# Patient Record
Sex: Female | Born: 1935 | Race: White | Hispanic: No | Marital: Married | Smoking: Never smoker
Health system: Southern US, Community
[De-identification: ages and names within clinical notes are randomized; demographics above are authoritative.]

## PROBLEM LIST (undated history)

## (undated) ENCOUNTER — Emergency Department (HOSPITAL_COMMUNITY): Payer: Medicare HMO | Source: Home / Self Care

## (undated) DIAGNOSIS — R12 Heartburn: Secondary | ICD-10-CM

## (undated) DIAGNOSIS — R112 Nausea with vomiting, unspecified: Secondary | ICD-10-CM

## (undated) DIAGNOSIS — Z9889 Other specified postprocedural states: Secondary | ICD-10-CM

## (undated) DIAGNOSIS — C801 Malignant (primary) neoplasm, unspecified: Secondary | ICD-10-CM

## (undated) DIAGNOSIS — G43909 Migraine, unspecified, not intractable, without status migrainosus: Secondary | ICD-10-CM

## (undated) DIAGNOSIS — R011 Cardiac murmur, unspecified: Secondary | ICD-10-CM

## (undated) DIAGNOSIS — D649 Anemia, unspecified: Secondary | ICD-10-CM

## (undated) DIAGNOSIS — D496 Neoplasm of unspecified behavior of brain: Secondary | ICD-10-CM

## (undated) DIAGNOSIS — I10 Essential (primary) hypertension: Secondary | ICD-10-CM

## (undated) HISTORY — PX: DILATION AND CURETTAGE OF UTERUS: SHX78

## (undated) HISTORY — PX: OTHER SURGICAL HISTORY: SHX169

## (undated) HISTORY — PX: BREAST SURGERY: SHX581

## (undated) HISTORY — PX: COLONOSCOPY W/ BIOPSIES AND POLYPECTOMY: SHX1376

---

## 2014-10-19 DIAGNOSIS — M5412 Radiculopathy, cervical region: Secondary | ICD-10-CM | POA: Diagnosis not present

## 2014-10-19 DIAGNOSIS — E785 Hyperlipidemia, unspecified: Secondary | ICD-10-CM | POA: Diagnosis not present

## 2014-10-19 DIAGNOSIS — Z6825 Body mass index (BMI) 25.0-25.9, adult: Secondary | ICD-10-CM | POA: Diagnosis not present

## 2014-10-19 DIAGNOSIS — M5382 Other specified dorsopathies, cervical region: Secondary | ICD-10-CM | POA: Diagnosis not present

## 2014-10-19 DIAGNOSIS — I10 Essential (primary) hypertension: Secondary | ICD-10-CM | POA: Diagnosis not present

## 2015-01-11 DIAGNOSIS — Z13820 Encounter for screening for osteoporosis: Secondary | ICD-10-CM | POA: Diagnosis not present

## 2015-01-11 DIAGNOSIS — M8580 Other specified disorders of bone density and structure, unspecified site: Secondary | ICD-10-CM | POA: Diagnosis not present

## 2015-01-11 DIAGNOSIS — Z1231 Encounter for screening mammogram for malignant neoplasm of breast: Secondary | ICD-10-CM | POA: Diagnosis not present

## 2015-01-11 DIAGNOSIS — Z853 Personal history of malignant neoplasm of breast: Secondary | ICD-10-CM | POA: Diagnosis not present

## 2015-01-11 DIAGNOSIS — M8589 Other specified disorders of bone density and structure, multiple sites: Secondary | ICD-10-CM | POA: Diagnosis not present

## 2015-01-25 DIAGNOSIS — Z1389 Encounter for screening for other disorder: Secondary | ICD-10-CM | POA: Diagnosis not present

## 2015-01-25 DIAGNOSIS — E785 Hyperlipidemia, unspecified: Secondary | ICD-10-CM | POA: Diagnosis not present

## 2015-01-25 DIAGNOSIS — I10 Essential (primary) hypertension: Secondary | ICD-10-CM | POA: Diagnosis not present

## 2015-01-25 DIAGNOSIS — Z9181 History of falling: Secondary | ICD-10-CM | POA: Diagnosis not present

## 2015-01-25 DIAGNOSIS — Z6826 Body mass index (BMI) 26.0-26.9, adult: Secondary | ICD-10-CM | POA: Diagnosis not present

## 2015-01-25 DIAGNOSIS — M8580 Other specified disorders of bone density and structure, unspecified site: Secondary | ICD-10-CM | POA: Diagnosis not present

## 2015-01-25 DIAGNOSIS — M5412 Radiculopathy, cervical region: Secondary | ICD-10-CM | POA: Diagnosis not present

## 2015-05-05 DIAGNOSIS — I10 Essential (primary) hypertension: Secondary | ICD-10-CM | POA: Diagnosis not present

## 2015-05-05 DIAGNOSIS — R600 Localized edema: Secondary | ICD-10-CM | POA: Diagnosis not present

## 2015-05-05 DIAGNOSIS — E785 Hyperlipidemia, unspecified: Secondary | ICD-10-CM | POA: Diagnosis not present

## 2015-05-05 DIAGNOSIS — Z6826 Body mass index (BMI) 26.0-26.9, adult: Secondary | ICD-10-CM | POA: Diagnosis not present

## 2015-05-05 DIAGNOSIS — Z139 Encounter for screening, unspecified: Secondary | ICD-10-CM | POA: Diagnosis not present

## 2015-05-05 DIAGNOSIS — M5412 Radiculopathy, cervical region: Secondary | ICD-10-CM | POA: Diagnosis not present

## 2015-11-10 DIAGNOSIS — E785 Hyperlipidemia, unspecified: Secondary | ICD-10-CM | POA: Diagnosis not present

## 2015-11-10 DIAGNOSIS — I10 Essential (primary) hypertension: Secondary | ICD-10-CM | POA: Diagnosis not present

## 2015-11-10 DIAGNOSIS — Z0000 Encounter for general adult medical examination without abnormal findings: Secondary | ICD-10-CM | POA: Diagnosis not present

## 2015-11-10 DIAGNOSIS — M8580 Other specified disorders of bone density and structure, unspecified site: Secondary | ICD-10-CM | POA: Diagnosis not present

## 2015-11-10 DIAGNOSIS — Z79899 Other long term (current) drug therapy: Secondary | ICD-10-CM | POA: Diagnosis not present

## 2015-11-10 DIAGNOSIS — R011 Cardiac murmur, unspecified: Secondary | ICD-10-CM | POA: Diagnosis not present

## 2015-11-10 DIAGNOSIS — Z6826 Body mass index (BMI) 26.0-26.9, adult: Secondary | ICD-10-CM | POA: Diagnosis not present

## 2015-11-10 DIAGNOSIS — Z9181 History of falling: Secondary | ICD-10-CM | POA: Diagnosis not present

## 2015-11-10 DIAGNOSIS — Z1389 Encounter for screening for other disorder: Secondary | ICD-10-CM | POA: Diagnosis not present

## 2015-11-15 DIAGNOSIS — I517 Cardiomegaly: Secondary | ICD-10-CM | POA: Diagnosis not present

## 2015-11-15 DIAGNOSIS — R0989 Other specified symptoms and signs involving the circulatory and respiratory systems: Secondary | ICD-10-CM | POA: Diagnosis not present

## 2015-11-15 DIAGNOSIS — I080 Rheumatic disorders of both mitral and aortic valves: Secondary | ICD-10-CM | POA: Diagnosis not present

## 2015-11-15 DIAGNOSIS — I771 Stricture of artery: Secondary | ICD-10-CM | POA: Diagnosis not present

## 2015-12-04 DIAGNOSIS — M79641 Pain in right hand: Secondary | ICD-10-CM | POA: Diagnosis not present

## 2015-12-04 DIAGNOSIS — S52511A Displaced fracture of right radial styloid process, initial encounter for closed fracture: Secondary | ICD-10-CM | POA: Diagnosis not present

## 2015-12-04 DIAGNOSIS — M25531 Pain in right wrist: Secondary | ICD-10-CM | POA: Diagnosis not present

## 2015-12-04 DIAGNOSIS — S60229A Contusion of unspecified hand, initial encounter: Secondary | ICD-10-CM | POA: Diagnosis not present

## 2015-12-13 DIAGNOSIS — S52511A Displaced fracture of right radial styloid process, initial encounter for closed fracture: Secondary | ICD-10-CM | POA: Diagnosis not present

## 2015-12-22 DIAGNOSIS — R05 Cough: Secondary | ICD-10-CM | POA: Diagnosis not present

## 2015-12-22 DIAGNOSIS — Z6826 Body mass index (BMI) 26.0-26.9, adult: Secondary | ICD-10-CM | POA: Diagnosis not present

## 2015-12-27 DIAGNOSIS — S52511A Displaced fracture of right radial styloid process, initial encounter for closed fracture: Secondary | ICD-10-CM | POA: Diagnosis not present

## 2016-01-10 DIAGNOSIS — S52511A Displaced fracture of right radial styloid process, initial encounter for closed fracture: Secondary | ICD-10-CM | POA: Diagnosis not present

## 2016-01-12 DIAGNOSIS — M85851 Other specified disorders of bone density and structure, right thigh: Secondary | ICD-10-CM | POA: Diagnosis not present

## 2016-01-12 DIAGNOSIS — Z1231 Encounter for screening mammogram for malignant neoplasm of breast: Secondary | ICD-10-CM | POA: Diagnosis not present

## 2016-01-12 DIAGNOSIS — M8589 Other specified disorders of bone density and structure, multiple sites: Secondary | ICD-10-CM | POA: Diagnosis not present

## 2016-01-12 DIAGNOSIS — R921 Mammographic calcification found on diagnostic imaging of breast: Secondary | ICD-10-CM | POA: Diagnosis not present

## 2016-01-24 DIAGNOSIS — R921 Mammographic calcification found on diagnostic imaging of breast: Secondary | ICD-10-CM | POA: Diagnosis not present

## 2016-01-24 DIAGNOSIS — R928 Other abnormal and inconclusive findings on diagnostic imaging of breast: Secondary | ICD-10-CM | POA: Diagnosis not present

## 2016-02-07 DIAGNOSIS — S52511A Displaced fracture of right radial styloid process, initial encounter for closed fracture: Secondary | ICD-10-CM | POA: Diagnosis not present

## 2016-02-07 DIAGNOSIS — R921 Mammographic calcification found on diagnostic imaging of breast: Secondary | ICD-10-CM | POA: Diagnosis not present

## 2016-02-07 DIAGNOSIS — R928 Other abnormal and inconclusive findings on diagnostic imaging of breast: Secondary | ICD-10-CM | POA: Diagnosis not present

## 2016-02-07 DIAGNOSIS — D0512 Intraductal carcinoma in situ of left breast: Secondary | ICD-10-CM | POA: Diagnosis not present

## 2016-02-27 DIAGNOSIS — D0512 Intraductal carcinoma in situ of left breast: Secondary | ICD-10-CM | POA: Diagnosis not present

## 2016-03-11 DIAGNOSIS — I10 Essential (primary) hypertension: Secondary | ICD-10-CM | POA: Diagnosis not present

## 2016-03-11 DIAGNOSIS — Z01818 Encounter for other preprocedural examination: Secondary | ICD-10-CM | POA: Diagnosis not present

## 2016-03-13 DIAGNOSIS — E785 Hyperlipidemia, unspecified: Secondary | ICD-10-CM | POA: Diagnosis not present

## 2016-03-13 DIAGNOSIS — K219 Gastro-esophageal reflux disease without esophagitis: Secondary | ICD-10-CM | POA: Diagnosis not present

## 2016-03-13 DIAGNOSIS — Z79899 Other long term (current) drug therapy: Secondary | ICD-10-CM | POA: Diagnosis not present

## 2016-03-13 DIAGNOSIS — I10 Essential (primary) hypertension: Secondary | ICD-10-CM | POA: Diagnosis not present

## 2016-03-13 DIAGNOSIS — J45909 Unspecified asthma, uncomplicated: Secondary | ICD-10-CM | POA: Diagnosis not present

## 2016-03-13 DIAGNOSIS — D0512 Intraductal carcinoma in situ of left breast: Secondary | ICD-10-CM | POA: Diagnosis not present

## 2016-04-15 DIAGNOSIS — D0512 Intraductal carcinoma in situ of left breast: Secondary | ICD-10-CM | POA: Diagnosis not present

## 2016-05-07 DIAGNOSIS — J309 Allergic rhinitis, unspecified: Secondary | ICD-10-CM | POA: Diagnosis not present

## 2016-05-07 DIAGNOSIS — N63 Unspecified lump in breast: Secondary | ICD-10-CM | POA: Diagnosis not present

## 2016-05-08 DIAGNOSIS — D508 Other iron deficiency anemias: Secondary | ICD-10-CM | POA: Diagnosis not present

## 2016-05-08 DIAGNOSIS — M25551 Pain in right hip: Secondary | ICD-10-CM | POA: Diagnosis not present

## 2016-05-08 DIAGNOSIS — Z853 Personal history of malignant neoplasm of breast: Secondary | ICD-10-CM | POA: Diagnosis not present

## 2016-05-08 DIAGNOSIS — E871 Hypo-osmolality and hyponatremia: Secondary | ICD-10-CM | POA: Diagnosis not present

## 2016-05-08 DIAGNOSIS — Z96641 Presence of right artificial hip joint: Secondary | ICD-10-CM | POA: Diagnosis not present

## 2016-05-08 DIAGNOSIS — S72011A Unspecified intracapsular fracture of right femur, initial encounter for closed fracture: Secondary | ICD-10-CM | POA: Diagnosis not present

## 2016-05-08 DIAGNOSIS — D649 Anemia, unspecified: Secondary | ICD-10-CM | POA: Diagnosis not present

## 2016-05-08 DIAGNOSIS — Z7982 Long term (current) use of aspirin: Secondary | ICD-10-CM | POA: Diagnosis not present

## 2016-05-08 DIAGNOSIS — R252 Cramp and spasm: Secondary | ICD-10-CM | POA: Diagnosis not present

## 2016-05-08 DIAGNOSIS — S72001A Fracture of unspecified part of neck of right femur, initial encounter for closed fracture: Secondary | ICD-10-CM | POA: Diagnosis not present

## 2016-05-08 DIAGNOSIS — M1990 Unspecified osteoarthritis, unspecified site: Secondary | ICD-10-CM | POA: Diagnosis not present

## 2016-05-08 DIAGNOSIS — S72009A Fracture of unspecified part of neck of unspecified femur, initial encounter for closed fracture: Secondary | ICD-10-CM | POA: Diagnosis not present

## 2016-05-08 DIAGNOSIS — Z471 Aftercare following joint replacement surgery: Secondary | ICD-10-CM | POA: Diagnosis not present

## 2016-05-08 DIAGNOSIS — Z79899 Other long term (current) drug therapy: Secondary | ICD-10-CM | POA: Diagnosis not present

## 2016-05-08 DIAGNOSIS — S72001D Fracture of unspecified part of neck of right femur, subsequent encounter for closed fracture with routine healing: Secondary | ICD-10-CM | POA: Diagnosis not present

## 2016-05-08 DIAGNOSIS — M84451A Pathological fracture, right femur, initial encounter for fracture: Secondary | ICD-10-CM | POA: Diagnosis not present

## 2016-05-08 DIAGNOSIS — S72041A Displaced fracture of base of neck of right femur, initial encounter for closed fracture: Secondary | ICD-10-CM | POA: Diagnosis not present

## 2016-05-08 DIAGNOSIS — M1611 Unilateral primary osteoarthritis, right hip: Secondary | ICD-10-CM | POA: Diagnosis not present

## 2016-05-08 DIAGNOSIS — S299XXA Unspecified injury of thorax, initial encounter: Secondary | ICD-10-CM | POA: Diagnosis not present

## 2016-05-08 DIAGNOSIS — I10 Essential (primary) hypertension: Secondary | ICD-10-CM | POA: Diagnosis not present

## 2016-05-09 DIAGNOSIS — S72001A Fracture of unspecified part of neck of right femur, initial encounter for closed fracture: Secondary | ICD-10-CM | POA: Diagnosis not present

## 2016-05-09 DIAGNOSIS — Z853 Personal history of malignant neoplasm of breast: Secondary | ICD-10-CM | POA: Diagnosis not present

## 2016-05-09 DIAGNOSIS — D649 Anemia, unspecified: Secondary | ICD-10-CM | POA: Diagnosis not present

## 2016-05-09 DIAGNOSIS — I10 Essential (primary) hypertension: Secondary | ICD-10-CM | POA: Diagnosis not present

## 2016-05-12 DIAGNOSIS — D649 Anemia, unspecified: Secondary | ICD-10-CM

## 2016-05-12 DIAGNOSIS — E871 Hypo-osmolality and hyponatremia: Secondary | ICD-10-CM

## 2016-05-13 DIAGNOSIS — R252 Cramp and spasm: Secondary | ICD-10-CM

## 2016-05-14 DIAGNOSIS — D649 Anemia, unspecified: Secondary | ICD-10-CM | POA: Diagnosis not present

## 2016-05-14 DIAGNOSIS — Z9181 History of falling: Secondary | ICD-10-CM | POA: Diagnosis not present

## 2016-05-14 DIAGNOSIS — M439 Deforming dorsopathy, unspecified: Secondary | ICD-10-CM | POA: Diagnosis not present

## 2016-05-14 DIAGNOSIS — S72001A Fracture of unspecified part of neck of right femur, initial encounter for closed fracture: Secondary | ICD-10-CM | POA: Diagnosis not present

## 2016-05-14 DIAGNOSIS — M1990 Unspecified osteoarthritis, unspecified site: Secondary | ICD-10-CM | POA: Diagnosis not present

## 2016-05-14 DIAGNOSIS — Z853 Personal history of malignant neoplasm of breast: Secondary | ICD-10-CM | POA: Diagnosis not present

## 2016-05-14 DIAGNOSIS — I10 Essential (primary) hypertension: Secondary | ICD-10-CM | POA: Diagnosis not present

## 2016-05-14 DIAGNOSIS — M150 Primary generalized (osteo)arthritis: Secondary | ICD-10-CM | POA: Diagnosis not present

## 2016-05-14 DIAGNOSIS — R252 Cramp and spasm: Secondary | ICD-10-CM | POA: Diagnosis not present

## 2016-05-14 DIAGNOSIS — E871 Hypo-osmolality and hyponatremia: Secondary | ICD-10-CM | POA: Diagnosis not present

## 2016-05-14 DIAGNOSIS — S72001D Fracture of unspecified part of neck of right femur, subsequent encounter for closed fracture with routine healing: Secondary | ICD-10-CM | POA: Diagnosis not present

## 2016-05-14 DIAGNOSIS — R262 Difficulty in walking, not elsewhere classified: Secondary | ICD-10-CM | POA: Diagnosis not present

## 2016-05-14 DIAGNOSIS — Z7901 Long term (current) use of anticoagulants: Secondary | ICD-10-CM | POA: Diagnosis not present

## 2016-05-14 DIAGNOSIS — D508 Other iron deficiency anemias: Secondary | ICD-10-CM | POA: Diagnosis not present

## 2016-05-14 DIAGNOSIS — G8918 Other acute postprocedural pain: Secondary | ICD-10-CM | POA: Diagnosis not present

## 2016-05-14 DIAGNOSIS — Z79899 Other long term (current) drug therapy: Secondary | ICD-10-CM | POA: Diagnosis not present

## 2016-05-14 DIAGNOSIS — Z7982 Long term (current) use of aspirin: Secondary | ICD-10-CM | POA: Diagnosis not present

## 2016-05-14 DIAGNOSIS — S72009A Fracture of unspecified part of neck of unspecified femur, initial encounter for closed fracture: Secondary | ICD-10-CM | POA: Diagnosis not present

## 2016-05-14 DIAGNOSIS — S72011A Unspecified intracapsular fracture of right femur, initial encounter for closed fracture: Secondary | ICD-10-CM | POA: Diagnosis not present

## 2016-05-14 DIAGNOSIS — Z79891 Long term (current) use of opiate analgesic: Secondary | ICD-10-CM | POA: Diagnosis not present

## 2016-05-18 DIAGNOSIS — D649 Anemia, unspecified: Secondary | ICD-10-CM | POA: Diagnosis not present

## 2016-05-18 DIAGNOSIS — M439 Deforming dorsopathy, unspecified: Secondary | ICD-10-CM | POA: Diagnosis not present

## 2016-05-18 DIAGNOSIS — G8918 Other acute postprocedural pain: Secondary | ICD-10-CM | POA: Diagnosis not present

## 2016-05-18 DIAGNOSIS — R262 Difficulty in walking, not elsewhere classified: Secondary | ICD-10-CM | POA: Diagnosis not present

## 2016-05-25 DIAGNOSIS — S72001D Fracture of unspecified part of neck of right femur, subsequent encounter for closed fracture with routine healing: Secondary | ICD-10-CM | POA: Diagnosis not present

## 2016-05-25 DIAGNOSIS — Z853 Personal history of malignant neoplasm of breast: Secondary | ICD-10-CM | POA: Diagnosis not present

## 2016-05-25 DIAGNOSIS — Z7982 Long term (current) use of aspirin: Secondary | ICD-10-CM | POA: Diagnosis not present

## 2016-05-25 DIAGNOSIS — Z9181 History of falling: Secondary | ICD-10-CM | POA: Diagnosis not present

## 2016-05-25 DIAGNOSIS — I10 Essential (primary) hypertension: Secondary | ICD-10-CM | POA: Diagnosis not present

## 2016-05-25 DIAGNOSIS — D649 Anemia, unspecified: Secondary | ICD-10-CM | POA: Diagnosis not present

## 2016-05-25 DIAGNOSIS — M150 Primary generalized (osteo)arthritis: Secondary | ICD-10-CM | POA: Diagnosis not present

## 2016-05-25 DIAGNOSIS — Z7901 Long term (current) use of anticoagulants: Secondary | ICD-10-CM | POA: Diagnosis not present

## 2016-05-25 DIAGNOSIS — Z79891 Long term (current) use of opiate analgesic: Secondary | ICD-10-CM | POA: Diagnosis not present

## 2016-05-28 DIAGNOSIS — D649 Anemia, unspecified: Secondary | ICD-10-CM | POA: Diagnosis not present

## 2016-05-28 DIAGNOSIS — Z853 Personal history of malignant neoplasm of breast: Secondary | ICD-10-CM | POA: Diagnosis not present

## 2016-05-28 DIAGNOSIS — Z79891 Long term (current) use of opiate analgesic: Secondary | ICD-10-CM | POA: Diagnosis not present

## 2016-05-28 DIAGNOSIS — Z9181 History of falling: Secondary | ICD-10-CM | POA: Diagnosis not present

## 2016-05-28 DIAGNOSIS — S72001D Fracture of unspecified part of neck of right femur, subsequent encounter for closed fracture with routine healing: Secondary | ICD-10-CM | POA: Diagnosis not present

## 2016-05-28 DIAGNOSIS — Z7982 Long term (current) use of aspirin: Secondary | ICD-10-CM | POA: Diagnosis not present

## 2016-05-28 DIAGNOSIS — I10 Essential (primary) hypertension: Secondary | ICD-10-CM | POA: Diagnosis not present

## 2016-05-28 DIAGNOSIS — M150 Primary generalized (osteo)arthritis: Secondary | ICD-10-CM | POA: Diagnosis not present

## 2016-05-28 DIAGNOSIS — Z7901 Long term (current) use of anticoagulants: Secondary | ICD-10-CM | POA: Diagnosis not present

## 2016-05-29 DIAGNOSIS — Z79891 Long term (current) use of opiate analgesic: Secondary | ICD-10-CM | POA: Diagnosis not present

## 2016-05-29 DIAGNOSIS — Z9181 History of falling: Secondary | ICD-10-CM | POA: Diagnosis not present

## 2016-05-29 DIAGNOSIS — Z7901 Long term (current) use of anticoagulants: Secondary | ICD-10-CM | POA: Diagnosis not present

## 2016-05-29 DIAGNOSIS — I10 Essential (primary) hypertension: Secondary | ICD-10-CM | POA: Diagnosis not present

## 2016-05-29 DIAGNOSIS — Z853 Personal history of malignant neoplasm of breast: Secondary | ICD-10-CM | POA: Diagnosis not present

## 2016-05-29 DIAGNOSIS — S72001D Fracture of unspecified part of neck of right femur, subsequent encounter for closed fracture with routine healing: Secondary | ICD-10-CM | POA: Diagnosis not present

## 2016-05-29 DIAGNOSIS — M150 Primary generalized (osteo)arthritis: Secondary | ICD-10-CM | POA: Diagnosis not present

## 2016-05-29 DIAGNOSIS — Z7982 Long term (current) use of aspirin: Secondary | ICD-10-CM | POA: Diagnosis not present

## 2016-05-29 DIAGNOSIS — D649 Anemia, unspecified: Secondary | ICD-10-CM | POA: Diagnosis not present

## 2016-05-30 DIAGNOSIS — Z9181 History of falling: Secondary | ICD-10-CM | POA: Diagnosis not present

## 2016-05-30 DIAGNOSIS — I10 Essential (primary) hypertension: Secondary | ICD-10-CM | POA: Diagnosis not present

## 2016-05-30 DIAGNOSIS — Z7901 Long term (current) use of anticoagulants: Secondary | ICD-10-CM | POA: Diagnosis not present

## 2016-05-30 DIAGNOSIS — Z79891 Long term (current) use of opiate analgesic: Secondary | ICD-10-CM | POA: Diagnosis not present

## 2016-05-30 DIAGNOSIS — D649 Anemia, unspecified: Secondary | ICD-10-CM | POA: Diagnosis not present

## 2016-05-30 DIAGNOSIS — S72001D Fracture of unspecified part of neck of right femur, subsequent encounter for closed fracture with routine healing: Secondary | ICD-10-CM | POA: Diagnosis not present

## 2016-05-30 DIAGNOSIS — Z853 Personal history of malignant neoplasm of breast: Secondary | ICD-10-CM | POA: Diagnosis not present

## 2016-05-30 DIAGNOSIS — Z7982 Long term (current) use of aspirin: Secondary | ICD-10-CM | POA: Diagnosis not present

## 2016-05-30 DIAGNOSIS — M150 Primary generalized (osteo)arthritis: Secondary | ICD-10-CM | POA: Diagnosis not present

## 2016-05-31 DIAGNOSIS — Z7982 Long term (current) use of aspirin: Secondary | ICD-10-CM | POA: Diagnosis not present

## 2016-05-31 DIAGNOSIS — I10 Essential (primary) hypertension: Secondary | ICD-10-CM | POA: Diagnosis not present

## 2016-05-31 DIAGNOSIS — S72001D Fracture of unspecified part of neck of right femur, subsequent encounter for closed fracture with routine healing: Secondary | ICD-10-CM | POA: Diagnosis not present

## 2016-05-31 DIAGNOSIS — M150 Primary generalized (osteo)arthritis: Secondary | ICD-10-CM | POA: Diagnosis not present

## 2016-05-31 DIAGNOSIS — Z853 Personal history of malignant neoplasm of breast: Secondary | ICD-10-CM | POA: Diagnosis not present

## 2016-05-31 DIAGNOSIS — Z9181 History of falling: Secondary | ICD-10-CM | POA: Diagnosis not present

## 2016-05-31 DIAGNOSIS — Z79891 Long term (current) use of opiate analgesic: Secondary | ICD-10-CM | POA: Diagnosis not present

## 2016-05-31 DIAGNOSIS — Z7901 Long term (current) use of anticoagulants: Secondary | ICD-10-CM | POA: Diagnosis not present

## 2016-05-31 DIAGNOSIS — D649 Anemia, unspecified: Secondary | ICD-10-CM | POA: Diagnosis not present

## 2016-06-03 DIAGNOSIS — D649 Anemia, unspecified: Secondary | ICD-10-CM | POA: Diagnosis not present

## 2016-06-03 DIAGNOSIS — Z853 Personal history of malignant neoplasm of breast: Secondary | ICD-10-CM | POA: Diagnosis not present

## 2016-06-03 DIAGNOSIS — Z7901 Long term (current) use of anticoagulants: Secondary | ICD-10-CM | POA: Diagnosis not present

## 2016-06-03 DIAGNOSIS — Z7982 Long term (current) use of aspirin: Secondary | ICD-10-CM | POA: Diagnosis not present

## 2016-06-03 DIAGNOSIS — S72001D Fracture of unspecified part of neck of right femur, subsequent encounter for closed fracture with routine healing: Secondary | ICD-10-CM | POA: Diagnosis not present

## 2016-06-03 DIAGNOSIS — I10 Essential (primary) hypertension: Secondary | ICD-10-CM | POA: Diagnosis not present

## 2016-06-03 DIAGNOSIS — Z79891 Long term (current) use of opiate analgesic: Secondary | ICD-10-CM | POA: Diagnosis not present

## 2016-06-03 DIAGNOSIS — M150 Primary generalized (osteo)arthritis: Secondary | ICD-10-CM | POA: Diagnosis not present

## 2016-06-03 DIAGNOSIS — Z9181 History of falling: Secondary | ICD-10-CM | POA: Diagnosis not present

## 2016-06-05 DIAGNOSIS — Z7982 Long term (current) use of aspirin: Secondary | ICD-10-CM | POA: Diagnosis not present

## 2016-06-05 DIAGNOSIS — I10 Essential (primary) hypertension: Secondary | ICD-10-CM | POA: Diagnosis not present

## 2016-06-05 DIAGNOSIS — Z9181 History of falling: Secondary | ICD-10-CM | POA: Diagnosis not present

## 2016-06-05 DIAGNOSIS — Z79891 Long term (current) use of opiate analgesic: Secondary | ICD-10-CM | POA: Diagnosis not present

## 2016-06-05 DIAGNOSIS — Z853 Personal history of malignant neoplasm of breast: Secondary | ICD-10-CM | POA: Diagnosis not present

## 2016-06-05 DIAGNOSIS — S72001D Fracture of unspecified part of neck of right femur, subsequent encounter for closed fracture with routine healing: Secondary | ICD-10-CM | POA: Diagnosis not present

## 2016-06-05 DIAGNOSIS — D649 Anemia, unspecified: Secondary | ICD-10-CM | POA: Diagnosis not present

## 2016-06-05 DIAGNOSIS — Z7901 Long term (current) use of anticoagulants: Secondary | ICD-10-CM | POA: Diagnosis not present

## 2016-06-05 DIAGNOSIS — M150 Primary generalized (osteo)arthritis: Secondary | ICD-10-CM | POA: Diagnosis not present

## 2016-06-06 DIAGNOSIS — Z7982 Long term (current) use of aspirin: Secondary | ICD-10-CM | POA: Diagnosis not present

## 2016-06-06 DIAGNOSIS — Z9181 History of falling: Secondary | ICD-10-CM | POA: Diagnosis not present

## 2016-06-06 DIAGNOSIS — Z853 Personal history of malignant neoplasm of breast: Secondary | ICD-10-CM | POA: Diagnosis not present

## 2016-06-06 DIAGNOSIS — D649 Anemia, unspecified: Secondary | ICD-10-CM | POA: Diagnosis not present

## 2016-06-06 DIAGNOSIS — M150 Primary generalized (osteo)arthritis: Secondary | ICD-10-CM | POA: Diagnosis not present

## 2016-06-06 DIAGNOSIS — Z7901 Long term (current) use of anticoagulants: Secondary | ICD-10-CM | POA: Diagnosis not present

## 2016-06-06 DIAGNOSIS — S72001D Fracture of unspecified part of neck of right femur, subsequent encounter for closed fracture with routine healing: Secondary | ICD-10-CM | POA: Diagnosis not present

## 2016-06-06 DIAGNOSIS — Z79891 Long term (current) use of opiate analgesic: Secondary | ICD-10-CM | POA: Diagnosis not present

## 2016-06-06 DIAGNOSIS — I10 Essential (primary) hypertension: Secondary | ICD-10-CM | POA: Diagnosis not present

## 2016-06-07 DIAGNOSIS — D649 Anemia, unspecified: Secondary | ICD-10-CM | POA: Diagnosis not present

## 2016-06-07 DIAGNOSIS — S72001A Fracture of unspecified part of neck of right femur, initial encounter for closed fracture: Secondary | ICD-10-CM | POA: Diagnosis not present

## 2016-06-07 DIAGNOSIS — Z79891 Long term (current) use of opiate analgesic: Secondary | ICD-10-CM | POA: Diagnosis not present

## 2016-06-07 DIAGNOSIS — I10 Essential (primary) hypertension: Secondary | ICD-10-CM | POA: Diagnosis not present

## 2016-06-07 DIAGNOSIS — M150 Primary generalized (osteo)arthritis: Secondary | ICD-10-CM | POA: Diagnosis not present

## 2016-06-07 DIAGNOSIS — D539 Nutritional anemia, unspecified: Secondary | ICD-10-CM | POA: Diagnosis not present

## 2016-06-07 DIAGNOSIS — Z7982 Long term (current) use of aspirin: Secondary | ICD-10-CM | POA: Diagnosis not present

## 2016-06-07 DIAGNOSIS — R350 Frequency of micturition: Secondary | ICD-10-CM | POA: Diagnosis not present

## 2016-06-07 DIAGNOSIS — Z7901 Long term (current) use of anticoagulants: Secondary | ICD-10-CM | POA: Diagnosis not present

## 2016-06-07 DIAGNOSIS — Z853 Personal history of malignant neoplasm of breast: Secondary | ICD-10-CM | POA: Diagnosis not present

## 2016-06-07 DIAGNOSIS — Z79899 Other long term (current) drug therapy: Secondary | ICD-10-CM | POA: Diagnosis not present

## 2016-06-07 DIAGNOSIS — S72001D Fracture of unspecified part of neck of right femur, subsequent encounter for closed fracture with routine healing: Secondary | ICD-10-CM | POA: Diagnosis not present

## 2016-06-07 DIAGNOSIS — Z9181 History of falling: Secondary | ICD-10-CM | POA: Diagnosis not present

## 2016-06-12 DIAGNOSIS — Z7901 Long term (current) use of anticoagulants: Secondary | ICD-10-CM | POA: Diagnosis not present

## 2016-06-12 DIAGNOSIS — Z9181 History of falling: Secondary | ICD-10-CM | POA: Diagnosis not present

## 2016-06-12 DIAGNOSIS — D649 Anemia, unspecified: Secondary | ICD-10-CM | POA: Diagnosis not present

## 2016-06-12 DIAGNOSIS — M150 Primary generalized (osteo)arthritis: Secondary | ICD-10-CM | POA: Diagnosis not present

## 2016-06-12 DIAGNOSIS — Z79891 Long term (current) use of opiate analgesic: Secondary | ICD-10-CM | POA: Diagnosis not present

## 2016-06-12 DIAGNOSIS — I10 Essential (primary) hypertension: Secondary | ICD-10-CM | POA: Diagnosis not present

## 2016-06-12 DIAGNOSIS — Z853 Personal history of malignant neoplasm of breast: Secondary | ICD-10-CM | POA: Diagnosis not present

## 2016-06-12 DIAGNOSIS — S72001D Fracture of unspecified part of neck of right femur, subsequent encounter for closed fracture with routine healing: Secondary | ICD-10-CM | POA: Diagnosis not present

## 2016-06-12 DIAGNOSIS — Z7982 Long term (current) use of aspirin: Secondary | ICD-10-CM | POA: Diagnosis not present

## 2016-06-13 DIAGNOSIS — Z7901 Long term (current) use of anticoagulants: Secondary | ICD-10-CM | POA: Diagnosis not present

## 2016-06-13 DIAGNOSIS — M150 Primary generalized (osteo)arthritis: Secondary | ICD-10-CM | POA: Diagnosis not present

## 2016-06-13 DIAGNOSIS — Z79891 Long term (current) use of opiate analgesic: Secondary | ICD-10-CM | POA: Diagnosis not present

## 2016-06-13 DIAGNOSIS — Z9181 History of falling: Secondary | ICD-10-CM | POA: Diagnosis not present

## 2016-06-13 DIAGNOSIS — D649 Anemia, unspecified: Secondary | ICD-10-CM | POA: Diagnosis not present

## 2016-06-13 DIAGNOSIS — S72001D Fracture of unspecified part of neck of right femur, subsequent encounter for closed fracture with routine healing: Secondary | ICD-10-CM | POA: Diagnosis not present

## 2016-06-13 DIAGNOSIS — Z7982 Long term (current) use of aspirin: Secondary | ICD-10-CM | POA: Diagnosis not present

## 2016-06-13 DIAGNOSIS — I10 Essential (primary) hypertension: Secondary | ICD-10-CM | POA: Diagnosis not present

## 2016-06-13 DIAGNOSIS — Z853 Personal history of malignant neoplasm of breast: Secondary | ICD-10-CM | POA: Diagnosis not present

## 2016-06-14 DIAGNOSIS — S72001D Fracture of unspecified part of neck of right femur, subsequent encounter for closed fracture with routine healing: Secondary | ICD-10-CM | POA: Diagnosis not present

## 2016-06-14 DIAGNOSIS — Z7982 Long term (current) use of aspirin: Secondary | ICD-10-CM | POA: Diagnosis not present

## 2016-06-14 DIAGNOSIS — Z7901 Long term (current) use of anticoagulants: Secondary | ICD-10-CM | POA: Diagnosis not present

## 2016-06-14 DIAGNOSIS — Z853 Personal history of malignant neoplasm of breast: Secondary | ICD-10-CM | POA: Diagnosis not present

## 2016-06-14 DIAGNOSIS — Z79891 Long term (current) use of opiate analgesic: Secondary | ICD-10-CM | POA: Diagnosis not present

## 2016-06-14 DIAGNOSIS — Z9181 History of falling: Secondary | ICD-10-CM | POA: Diagnosis not present

## 2016-06-14 DIAGNOSIS — M150 Primary generalized (osteo)arthritis: Secondary | ICD-10-CM | POA: Diagnosis not present

## 2016-06-14 DIAGNOSIS — D649 Anemia, unspecified: Secondary | ICD-10-CM | POA: Diagnosis not present

## 2016-06-14 DIAGNOSIS — I10 Essential (primary) hypertension: Secondary | ICD-10-CM | POA: Diagnosis not present

## 2016-06-18 DIAGNOSIS — Z7901 Long term (current) use of anticoagulants: Secondary | ICD-10-CM | POA: Diagnosis not present

## 2016-06-18 DIAGNOSIS — S72001D Fracture of unspecified part of neck of right femur, subsequent encounter for closed fracture with routine healing: Secondary | ICD-10-CM | POA: Diagnosis not present

## 2016-06-18 DIAGNOSIS — Z9181 History of falling: Secondary | ICD-10-CM | POA: Diagnosis not present

## 2016-06-18 DIAGNOSIS — Z7982 Long term (current) use of aspirin: Secondary | ICD-10-CM | POA: Diagnosis not present

## 2016-06-18 DIAGNOSIS — I10 Essential (primary) hypertension: Secondary | ICD-10-CM | POA: Diagnosis not present

## 2016-06-18 DIAGNOSIS — D649 Anemia, unspecified: Secondary | ICD-10-CM | POA: Diagnosis not present

## 2016-06-18 DIAGNOSIS — M150 Primary generalized (osteo)arthritis: Secondary | ICD-10-CM | POA: Diagnosis not present

## 2016-06-18 DIAGNOSIS — Z853 Personal history of malignant neoplasm of breast: Secondary | ICD-10-CM | POA: Diagnosis not present

## 2016-06-18 DIAGNOSIS — Z79891 Long term (current) use of opiate analgesic: Secondary | ICD-10-CM | POA: Diagnosis not present

## 2016-06-19 DIAGNOSIS — S72001A Fracture of unspecified part of neck of right femur, initial encounter for closed fracture: Secondary | ICD-10-CM | POA: Diagnosis not present

## 2016-06-20 DIAGNOSIS — Z853 Personal history of malignant neoplasm of breast: Secondary | ICD-10-CM | POA: Diagnosis not present

## 2016-06-20 DIAGNOSIS — Z7901 Long term (current) use of anticoagulants: Secondary | ICD-10-CM | POA: Diagnosis not present

## 2016-06-20 DIAGNOSIS — Z7982 Long term (current) use of aspirin: Secondary | ICD-10-CM | POA: Diagnosis not present

## 2016-06-20 DIAGNOSIS — Z79891 Long term (current) use of opiate analgesic: Secondary | ICD-10-CM | POA: Diagnosis not present

## 2016-06-20 DIAGNOSIS — Z9181 History of falling: Secondary | ICD-10-CM | POA: Diagnosis not present

## 2016-06-20 DIAGNOSIS — I10 Essential (primary) hypertension: Secondary | ICD-10-CM | POA: Diagnosis not present

## 2016-06-20 DIAGNOSIS — D649 Anemia, unspecified: Secondary | ICD-10-CM | POA: Diagnosis not present

## 2016-06-20 DIAGNOSIS — S72001D Fracture of unspecified part of neck of right femur, subsequent encounter for closed fracture with routine healing: Secondary | ICD-10-CM | POA: Diagnosis not present

## 2016-06-20 DIAGNOSIS — M150 Primary generalized (osteo)arthritis: Secondary | ICD-10-CM | POA: Diagnosis not present

## 2016-07-10 DIAGNOSIS — I10 Essential (primary) hypertension: Secondary | ICD-10-CM | POA: Diagnosis not present

## 2016-07-10 DIAGNOSIS — E785 Hyperlipidemia, unspecified: Secondary | ICD-10-CM | POA: Diagnosis not present

## 2016-08-14 DIAGNOSIS — H524 Presbyopia: Secondary | ICD-10-CM | POA: Diagnosis not present

## 2016-08-14 DIAGNOSIS — H2513 Age-related nuclear cataract, bilateral: Secondary | ICD-10-CM | POA: Diagnosis not present

## 2016-09-04 DIAGNOSIS — I10 Essential (primary) hypertension: Secondary | ICD-10-CM | POA: Diagnosis not present

## 2016-10-16 DIAGNOSIS — D539 Nutritional anemia, unspecified: Secondary | ICD-10-CM | POA: Diagnosis not present

## 2016-10-16 DIAGNOSIS — I10 Essential (primary) hypertension: Secondary | ICD-10-CM | POA: Diagnosis not present

## 2016-10-16 DIAGNOSIS — E785 Hyperlipidemia, unspecified: Secondary | ICD-10-CM | POA: Diagnosis not present

## 2016-10-16 DIAGNOSIS — R600 Localized edema: Secondary | ICD-10-CM | POA: Diagnosis not present

## 2016-10-30 DIAGNOSIS — S72001D Fracture of unspecified part of neck of right femur, subsequent encounter for closed fracture with routine healing: Secondary | ICD-10-CM | POA: Diagnosis not present

## 2016-11-29 DIAGNOSIS — H43813 Vitreous degeneration, bilateral: Secondary | ICD-10-CM | POA: Diagnosis not present

## 2016-11-29 DIAGNOSIS — I10 Essential (primary) hypertension: Secondary | ICD-10-CM | POA: Diagnosis not present

## 2016-11-29 DIAGNOSIS — H25813 Combined forms of age-related cataract, bilateral: Secondary | ICD-10-CM | POA: Diagnosis not present

## 2016-11-29 DIAGNOSIS — H43811 Vitreous degeneration, right eye: Secondary | ICD-10-CM | POA: Diagnosis not present

## 2016-11-29 DIAGNOSIS — H43393 Other vitreous opacities, bilateral: Secondary | ICD-10-CM | POA: Diagnosis not present

## 2016-12-02 DIAGNOSIS — H547 Unspecified visual loss: Secondary | ICD-10-CM | POA: Diagnosis not present

## 2016-12-02 DIAGNOSIS — R51 Headache: Secondary | ICD-10-CM | POA: Diagnosis not present

## 2016-12-02 DIAGNOSIS — I10 Essential (primary) hypertension: Secondary | ICD-10-CM | POA: Diagnosis not present

## 2016-12-05 ENCOUNTER — Encounter (HOSPITAL_COMMUNITY): Payer: Self-pay

## 2016-12-05 ENCOUNTER — Inpatient Hospital Stay (HOSPITAL_COMMUNITY)
Admission: EM | Admit: 2016-12-05 | Discharge: 2016-12-06 | DRG: 064 | Disposition: A | Discharge status: Home / Self Care | Payer: Medicare HMO | Attending: Family Medicine | Admitting: Family Medicine

## 2016-12-05 ENCOUNTER — Emergency Department (HOSPITAL_COMMUNITY): Payer: Medicare HMO

## 2016-12-05 DIAGNOSIS — H5347 Heteronymous bilateral field defects: Secondary | ICD-10-CM | POA: Diagnosis not present

## 2016-12-05 DIAGNOSIS — Z853 Personal history of malignant neoplasm of breast: Secondary | ICD-10-CM | POA: Diagnosis not present

## 2016-12-05 DIAGNOSIS — Z889 Allergy status to unspecified drugs, medicaments and biological substances status: Secondary | ICD-10-CM

## 2016-12-05 DIAGNOSIS — I629 Nontraumatic intracranial hemorrhage, unspecified: Secondary | ICD-10-CM | POA: Diagnosis not present

## 2016-12-05 DIAGNOSIS — Z833 Family history of diabetes mellitus: Secondary | ICD-10-CM

## 2016-12-05 DIAGNOSIS — I6200 Nontraumatic subdural hemorrhage, unspecified: Secondary | ICD-10-CM | POA: Diagnosis present

## 2016-12-05 DIAGNOSIS — Z79899 Other long term (current) drug therapy: Secondary | ICD-10-CM | POA: Diagnosis not present

## 2016-12-05 DIAGNOSIS — Z8249 Family history of ischemic heart disease and other diseases of the circulatory system: Secondary | ICD-10-CM | POA: Diagnosis not present

## 2016-12-05 DIAGNOSIS — R220 Localized swelling, mass and lump, head: Secondary | ICD-10-CM | POA: Diagnosis not present

## 2016-12-05 DIAGNOSIS — I10 Essential (primary) hypertension: Secondary | ICD-10-CM

## 2016-12-05 DIAGNOSIS — K219 Gastro-esophageal reflux disease without esophagitis: Secondary | ICD-10-CM | POA: Diagnosis not present

## 2016-12-05 DIAGNOSIS — E785 Hyperlipidemia, unspecified: Secondary | ICD-10-CM | POA: Diagnosis present

## 2016-12-05 DIAGNOSIS — E8770 Fluid overload, unspecified: Secondary | ICD-10-CM | POA: Diagnosis not present

## 2016-12-05 DIAGNOSIS — I619 Nontraumatic intracerebral hemorrhage, unspecified: Secondary | ICD-10-CM | POA: Diagnosis not present

## 2016-12-05 DIAGNOSIS — I160 Hypertensive urgency: Secondary | ICD-10-CM | POA: Diagnosis not present

## 2016-12-05 DIAGNOSIS — C7931 Secondary malignant neoplasm of brain: Secondary | ICD-10-CM | POA: Diagnosis not present

## 2016-12-05 DIAGNOSIS — R011 Cardiac murmur, unspecified: Secondary | ICD-10-CM | POA: Diagnosis not present

## 2016-12-05 DIAGNOSIS — Z96649 Presence of unspecified artificial hip joint: Secondary | ICD-10-CM | POA: Diagnosis present

## 2016-12-05 DIAGNOSIS — I700 Atherosclerosis of aorta: Secondary | ICD-10-CM | POA: Diagnosis not present

## 2016-12-05 DIAGNOSIS — G936 Cerebral edema: Secondary | ICD-10-CM | POA: Diagnosis present

## 2016-12-05 DIAGNOSIS — R51 Headache: Secondary | ICD-10-CM | POA: Diagnosis not present

## 2016-12-05 DIAGNOSIS — G9389 Other specified disorders of brain: Secondary | ICD-10-CM | POA: Diagnosis not present

## 2016-12-05 DIAGNOSIS — I618 Other nontraumatic intracerebral hemorrhage: Secondary | ICD-10-CM | POA: Diagnosis not present

## 2016-12-05 DIAGNOSIS — H547 Unspecified visual loss: Secondary | ICD-10-CM | POA: Diagnosis not present

## 2016-12-05 DIAGNOSIS — I611 Nontraumatic intracerebral hemorrhage in hemisphere, cortical: Secondary | ICD-10-CM

## 2016-12-05 HISTORY — DX: Essential (primary) hypertension: I10

## 2016-12-05 HISTORY — DX: Migraine, unspecified, not intractable, without status migrainosus: G43.909

## 2016-12-05 HISTORY — DX: Malignant (primary) neoplasm, unspecified: C80.1

## 2016-12-05 LAB — COMPREHENSIVE METABOLIC PANEL
ALK PHOS: 45 U/L (ref 38–126)
ALT: 15 U/L (ref 14–54)
ANION GAP: 6 (ref 5–15)
AST: 24 U/L (ref 15–41)
Albumin: 3.9 g/dL (ref 3.5–5.0)
BILIRUBIN TOTAL: 0.7 mg/dL (ref 0.3–1.2)
BUN: 8 mg/dL (ref 6–20)
CALCIUM: 9.5 mg/dL (ref 8.9–10.3)
CO2: 27 mmol/L (ref 22–32)
Chloride: 106 mmol/L (ref 101–111)
Creatinine, Ser: 0.81 mg/dL (ref 0.44–1.00)
GFR calc non Af Amer: >60 mL/min (ref >60)
Glucose, Bld: 105 mg/dL — ABNORMAL HIGH (ref 65–99)
Potassium: 3.5 mmol/L (ref 3.5–5.1)
SODIUM: 139 mmol/L (ref 135–145)
TOTAL PROTEIN: 6.8 g/dL (ref 6.5–8.1)

## 2016-12-05 LAB — RAPID URINE DRUG SCREEN, HOSP PERFORMED
Amphetamines: NOT DETECTED
Barbiturates: POSITIVE — AB
Benzodiazepines: NOT DETECTED
Cocaine: NOT DETECTED
OPIATES: NOT DETECTED
Tetrahydrocannabinol: NOT DETECTED

## 2016-12-05 LAB — DIFFERENTIAL
Basophils Absolute: 0.1 10*3/uL (ref 0.0–0.1)
Basophils Relative: 1 %
EOS ABS: 0.3 10*3/uL (ref 0.0–0.7)
EOS PCT: 3 %
LYMPHS PCT: 41 %
Lymphs Abs: 3.1 10*3/uL (ref 0.7–4.0)
MONO ABS: 0.8 10*3/uL (ref 0.1–1.0)
Monocytes Relative: 10 %
Neutro Abs: 3.4 10*3/uL (ref 1.7–7.7)
Neutrophils Relative %: 45 %

## 2016-12-05 LAB — I-STAT CHEM 8, ED
BUN: 9 mg/dL (ref 6–20)
CALCIUM ION: 1.16 mmol/L (ref 1.15–1.40)
Chloride: 103 mmol/L (ref 101–111)
Creatinine, Ser: 0.7 mg/dL (ref 0.44–1.00)
GLUCOSE: 102 mg/dL — AB (ref 65–99)
HCT: 38 % (ref 36.0–46.0)
HEMOGLOBIN: 12.9 g/dL (ref 12.0–15.0)
Potassium: 3.4 mmol/L — ABNORMAL LOW (ref 3.5–5.1)
SODIUM: 139 mmol/L (ref 135–145)
TCO2: 26 mmol/L (ref 0–100)

## 2016-12-05 LAB — CBC
HCT: 36.4 % (ref 36.0–46.0)
Hemoglobin: 11.7 g/dL — ABNORMAL LOW (ref 12.0–15.0)
MCH: 25.4 pg — AB (ref 26.0–34.0)
MCHC: 32.1 g/dL (ref 30.0–36.0)
MCV: 79.1 fL (ref 78.0–100.0)
PLATELETS: 270 10*3/uL (ref 150–400)
RBC: 4.6 MIL/uL (ref 3.87–5.11)
RDW: 14.4 % (ref 11.5–15.5)
WBC: 7.5 10*3/uL (ref 4.0–10.5)

## 2016-12-05 LAB — URINALYSIS, ROUTINE W REFLEX MICROSCOPIC
Bilirubin Urine: NEGATIVE
Glucose, UA: NEGATIVE mg/dL
Hgb urine dipstick: NEGATIVE
Ketones, ur: NEGATIVE mg/dL
LEUKOCYTES UA: NEGATIVE
NITRITE: NEGATIVE
PH: 7 (ref 5.0–8.0)
Protein, ur: NEGATIVE mg/dL
SPECIFIC GRAVITY, URINE: 1.004 — AB (ref 1.005–1.030)

## 2016-12-05 LAB — I-STAT TROPONIN, ED: Troponin i, poc: 0 ng/mL (ref 0.00–0.08)

## 2016-12-05 LAB — APTT: aPTT: 33 seconds (ref 24–36)

## 2016-12-05 LAB — PROTIME-INR
INR: 0.98
PROTHROMBIN TIME: 13 s (ref 11.4–15.2)

## 2016-12-05 MED ORDER — SODIUM CHLORIDE 0.9% FLUSH
3.0000 mL | Freq: Two times a day (BID) | INTRAVENOUS | Status: DC
  Administered 2016-12-06: 3 mL via INTRAVENOUS

## 2016-12-05 MED ORDER — PANTOPRAZOLE SODIUM 40 MG PO TBEC
40.0000 mg | DELAYED_RELEASE_TABLET | Freq: Every day | ORAL | Status: DC | PRN
  Administered 2016-12-06: 40 mg via ORAL
  Filled 2016-12-05: qty 1

## 2016-12-05 MED ORDER — NORTRIPTYLINE HCL 25 MG PO CAPS
25.0000 mg | ORAL_CAPSULE | Freq: Every day | ORAL | Status: DC
  Administered 2016-12-06: 25 mg via ORAL
  Filled 2016-12-05 (×2): qty 1

## 2016-12-05 MED ORDER — SODIUM CHLORIDE 0.9% FLUSH: 3.0000 mL | INTRAVENOUS | Status: DC | PRN

## 2016-12-05 MED ORDER — PRAVASTATIN SODIUM 10 MG PO TABS
10.0000 mg | ORAL_TABLET | Freq: Every day | ORAL | Status: DC
  Administered 2016-12-06: 10 mg via ORAL
  Filled 2016-12-05 (×2): qty 1

## 2016-12-05 MED ORDER — ACETAMINOPHEN 325 MG PO TABS
650.0000 mg | ORAL_TABLET | Freq: Four times a day (QID) | ORAL | Status: DC | PRN
  Administered 2016-12-06: 650 mg via ORAL
  Filled 2016-12-05: qty 2

## 2016-12-05 MED ORDER — SODIUM CHLORIDE 0.9 % IV SOLN: 250.0000 mL | INTRAVENOUS | Status: DC | PRN

## 2016-12-05 MED ORDER — ACETAMINOPHEN 650 MG RE SUPP: 650.0000 mg | Freq: Four times a day (QID) | RECTAL | Status: DC | PRN

## 2016-12-05 MED ORDER — CLEVIDIPINE BUTYRATE 0.5 MG/ML IV EMUL
0.0000 mg/h | INTRAVENOUS | Status: DC
Start: 2016-12-05 — End: 2016-12-06
  Administered 2016-12-05: 1 mg/h via INTRAVENOUS
  Filled 2016-12-05 (×2): qty 50
  Filled 2016-12-05: qty 100
  Filled 2016-12-05: qty 50

## 2016-12-05 NOTE — ED Notes (Signed)
Back from b/r, steady gait. VSS. Mentions mild HA, bilateral forehead, 1/10. Also describes vision as "not blurry or hazyor double", "some areas of visual field are missing, multiple speck-like areas, R>L", (denies: nausea, sob or dizziness, numbness, tingling or weakness). Speech clear.

## 2016-12-05 NOTE — H&P (Signed)
[facility] Admission History and Physical Service Pager: [phone]  Patient name: AMIRAH OUTWATER Medical record number: [id number] Date of birth: Sep 08, 1934 Age: 83 y.o. Gender: female  Primary Care Provider: Laverna Peace, NP Consultants: Neurology Code Status: Full  Chief Complaint: Changes in vision  Assessment and Plan: MAREYA RANK is a 83 y.o. female presenting with changes in vision, found to have an intracranial hemorrhage 2/2 a brain metastasis. PMH is significant for breast cancer, migraines.  Brain Metastasis: Pt with vision changes and dull headache for the last few weeks. PCP performed MRI, which showed a > 2 cm hemorrhage in the L occipital lobe secondary to a hemorrhagic metastasis approximately 58mm in diameter with surrounding vasogenic edema. No other focal neurological deficits. 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  - Admit to stepdown under inpatient status, attending Dr. Ardelia Mems. - Neurology consulted, appreciate recommendations - Will consult Neurosurgery in the morning for possible brain biopsy. - Will likely need to consult hem/onc this admission as well - Neuro checks every 4 hours x 24 hours - Seizure precautions - Tylenol prn for headache  Hypertensive Urgency: BP 212/103 in the ED. Started on Cleviprex, which normalized pressures. Takes Coreg 12.5 bid and Hydralazine 25-50mg  bid at home. Pt has allergies to ACE/ARB and Norvasc. - Transition off Cleviprex drip to PO medications tonight. Will give Norvasc 2.5mg  x 1, then stop drip in 1 hour. Norvasc not ideal with patient's lower extremity edema, but limited options available with patient's multiple drug allergies. Discussed with pharmacy. - Restart Coreg 12.5mg  bid in the AM - Monitor BPs closely - Will not restart  Hydralazine, as it can cause cerebral edema  Mild Volume Overload / Cardiac Murmur: Pt noted to have symmetric 1+ edema to the mid shins bilaterally and mild JVD. No crackles auscultated on lung exam. She has a III/VI systolic heart murmur heard loudest in the RUSB. CXR clear. She has never had an ECHO in our records. - Obtain BNP - Can consider ECHO this admission  GERD: Takes Prilosec and Nexium at home. - Protonix 40mg  daily prn while hospitalized  HLD: - Continue home Pravastatin 10mg   FEN/GI: NPO at midnight in case of possible procedure in the morning. Prophylaxis: SCDs  Disposition: Anticipate discharge home in the next 2-4 days, pending neurosurgery recommendations.  History of Present Illness:  JEANNETT HOLLRAH is a 83 y.o. female presenting with visual changes. On 2/20, she was in a meeting when she started seeing black spots in both eyes. She was also having a dull headache. The headache has been coming and going but the vision hasn't improved at all. On 2/23, she saw her sister's eye doctor, who said she didn't have a retinal detachment. She called her PCP after this, who recommended that she get an MRI. She went to get an MRI this morning. She then got a call from her doctor that they saw a tumor in her brain with some bleeding. Her doctor recommended that she go to the emergency department.  No weakness or numbness of arms/legs, no confusion.  She had R breast cancer 30 years ago, treated with lumpectomy and lymph node removal. She was diagnosed with cancer of the left breast 8 months ago by routine mammogram. She had another lumpectomy 03/2016. She was told that  this was a ductal carcinoma in situ and she was told that this was not a reoccurrence of her breast cancer. She fractured her right hip in 05/2016 and now had a partial hip replacement.  In the ED, she was hypertensive to 212/103. She was started on Cleviprex gtt. Labs were otherwise unremarkable. She was admitted for further  management.  Review Of Systems: Per HPI with the following additions: see below  Review of Systems  Constitutional: Negative for chills and fever.  HENT: Negative for hearing loss and tinnitus.   Eyes: Positive for blurred vision. Negative for photophobia.  Respiratory: Negative for shortness of breath.   Cardiovascular: Negative for chest pain.  Gastrointestinal: Negative for abdominal pain, nausea and vomiting.  Genitourinary: Negative for dysuria and frequency.  Musculoskeletal: Negative for joint pain and myalgias.  Neurological: Positive for headaches. Negative for dizziness, tingling, sensory change, speech change, focal weakness and seizures.    Patient Active Problem List   Diagnosis Date Noted  . ICH (intracerebral hemorrhage) ([place]) 12/05/2016    Past Medical History: Past Medical History:  Diagnosis Date  . Cancer [facility])    Breast Cancer  . Migraines     Past Surgical History: Past Surgical History:  Procedure Laterality Date  . BREAST SURGERY    . Partial hip replacement      Social History: Social History  Substance Use Topics  . Smoking status: Never Smoker  . Smokeless tobacco: Never Used  . Alcohol use No   Additional social history: Lives at home with her husband. Please also refer to relevant sections of EMR.  Family History: Mother- T2DM, HTN Brother-T2DM   Allergies and Medications: Allergies  Allergen Reactions  . Ace Inhibitors Cough    Per [facility]  . Amlodipine Other (See Comments)    Headache, per [facility]  . Angiotensin Receptor Blockers Cough    Per [facility]  . Percocet [Oxycodone-Acetaminophen] Other (See Comments)    "Worms crawling in" patient's head sensation   No current facility-administered medications on file prior to encounter.    No current outpatient prescriptions on file prior to encounter.    Objective: BP 118/77   Pulse 74   Temp 98.3 F (36.8 C) (Oral)   Resp 11   Ht 5\' 3"  (1.6 m)   Wt 147 lb (66.7 kg)   SpO2 99%    BMI 26.04 kg/m  Exam: General: Well-appearing, pleasant, talkative, in NAD Eyes: EOMI, PERRLA ENTM: Nose normal, MMM Neck: Supple, full ROM, mild JVD Cardiovascular: RRR, III/VI systolic murmur heard loudest in the RUSB Respiratory: CTAB, no crackles Gastrointestinal: +BS, soft, non-tender, non-distended MSK: 1+ symmetric pitting edema to the mid shins bilaterally, warm and well-perfused Derm: No rashes or lesions on exposed skin Neuro: Awake, alert, oriented, CN 2-12 intact, 5/5 muscle strength in the upper and lower extremities bilaterally, sensation intact to light touch throughout, reflexes 2+ and symmetric, normal finger-to-nose testing. Psych: Appropriate affect, normal behavior  Labs and Imaging: CBC BMET   Recent Labs Lab 12/05/16 1743 12/05/16 1756  WBC 7.5  --   HGB 11.7* 12.9  HCT 36.4 38.0  PLT 270  --     Recent Labs Lab 12/05/16 1743 12/05/16 1756  NA 139 139  K 3.5 3.4*  CL 106 103  CO2 27  --   BUN 8 9  CREATININE 0.81 0.70  GLUCOSE 105* 102*  CALCIUM 9.5  --      -Troponin 0.00 -UDS positive for barbiturates   -CXR clear  Sela Hua, MD 12/05/2016, 8:30 PM PGY-2, [place] [facility] pager: [phone], text pages welcome

## 2016-12-05 NOTE — ED Notes (Signed)
Given snack/drink, no changes, family at [facility].

## 2016-12-05 NOTE — ED Notes (Signed)
MD at [facility]

## 2016-12-05 NOTE — ED Notes (Signed)
Alert, NAD, calm, interactive, resps e/u, speaking in clear complete sentences, no dyspnea noted, skin W&D, VSS,BP elevated, pt talking on phone. Family at [facility].

## 2016-12-05 NOTE — Consult Note (Signed)
NEURO HOSPITALIST CONSULT NOTE   Requestig physician: Dr. Jeanell Sparrow  Reason for Consult: Headaches and blurry vision. Intracranial mass seen on CT head.   History obtained from:  Patient and Chart     HPI:                                                                                                                                          Savannah Beltran is an 83 y.o. female who presented with a one week history of intermittent headache and right-sided blurred vision. She went to her PCP for this and an MRI was ordered, revealing a hemorrhagic tumor within the left occipital lobe. She does not endorse any focal weakness, numbness, confusion or aphasia. The headache is located along the back of her head on the right.   She has a history of right breast cancer 30 years ago and left breast cancer 6.5 years ago. She had a lumpectomy without further treatment.   MRI from outside facility revealed a hemorrhagic mass in the left occipital lobe with a central enhancing component measuring 12 mm in diameter. There was a surrounding hemorrhagic component estimated to be days to weeks old based upon the T1-hyperintense methemoglobin signal, 27 x 25 mm in cross section. Mild surrounding vasogenic edema was noted. Also seen was a tiny associated 2 mm thick subacute subdural hematoma over the left occipital lobe.   Past Medical History:  Diagnosis Date  . Cancer [facility])    Breast Cancer  . Migraines     Past Surgical History:  Procedure Laterality Date  . BREAST SURGERY    . Partial hip replacement      No family history on file.  Social History:  reports that she has never smoked. She has never used smokeless tobacco. She reports that she does not drink alcohol or use drugs.  Allergies  Allergen Reactions  . Ace Inhibitors Cough    Per [facility]  . Amlodipine Other (See Comments)    Headache, per [facility]  . Angiotensin Receptor Blockers Cough    Per [facility]  . Percocet  [Oxycodone-Acetaminophen] Other (See Comments)    "Worms crawling in" patient's head sensation    MEDICATIONS:  Prior to Admission:  (Not in a hospital admission) Scheduled: . nortriptyline  25 mg Oral QHS  . pravastatin  10 mg Oral QHS  . sodium chloride flush  3 mL Intravenous Q12H  . sodium chloride flush  3 mL Intravenous Q12H    ROS:                                                                                                                                       No weight loss, chest pain or cough. Other ROS as per HPI.   Blood pressure 123/89, pulse 88, temperature 98.3 F (36.8 C), temperature source Oral, resp. rate 18, height 5\' 3"  (1.6 m), weight 66.7 kg (147 lb), SpO2 97 %.   General Examination:                                                                                                      HEENT-  Normocephalic/atraumatic.  Lungs- Respirations unlabored. Extremities- Warm and well perfused  Neurological Examination Mental Status: Alert, oriented, thought content appropriate.  Speech fluent without evidence of aphasia.  Able to follow all commands without difficulty. Cranial Nerves: II: Homonymous crescentic visual field cut on the right OU. PERRL III,IV, VI: ptosis not present, EOMI without nystagmus V,VII: No facial droop noted. Facial temperature sensation normal bilaterally VIII: hearing intact to conversation IX,X: No hypophonia XI: Symmetric XII: midline tongue extension Motor: Right : Upper extremity   5/5    Left:     Upper extremity   5/5  Lower extremity   5/5     Lower extremity   5/5 Sensory: Temperature and light touch intact in all 4 extremities  Deep Tendon Reflexes: 2+ and symmetric throughout Plantars: Right: downgoing   Left: downgoing Cerebellar: No ataxia with FNF bilaterally Gait: Deferred  Lab Results: Basic Metabolic  Panel:  Recent Labs Lab 12/05/16 1743 12/05/16 1756  NA 139 139  K 3.5 3.4*  CL 106 103  CO2 27  --   GLUCOSE 105* 102*  BUN 8 9  CREATININE 0.81 0.70  CALCIUM 9.5  --     Liver Function Tests:  Recent Labs Lab 12/05/16 1743  AST 24  ALT 15  ALKPHOS 45  BILITOT 0.7  PROT 6.8  ALBUMIN 3.9   No results for input(s): LIPASE, AMYLASE in the last 168 hours. No results for input(s): AMMONIA in the last 168 hours.  CBC:  Recent Labs Lab 12/05/16 1743 12/05/16 1756  WBC 7.5  --  NEUTROABS 3.4  --   HGB 11.7* 12.9  HCT 36.4 38.0  MCV 79.1  --   PLT 270  --     Cardiac Enzymes: No results for input(s): CKTOTAL, CKMB, CKMBINDEX, TROPONINI in the last 168 hours.  Lipid Panel: No results for input(s): CHOL, TRIG, HDL, CHOLHDL, VLDL, LDLCALC in the last 168 hours.  CBG: No results for input(s): GLUCAP in the last 168 hours.  Microbiology: No results found for this or any previous visit.  Coagulation Studies:  Recent Labs  12/05/16 1743  LABPROT 13.0  INR 0.98    Imaging: Dg Chest Port 1 View  Result Date: 12/05/2016 CLINICAL DATA:  83 y/o  F; intracranial hemorrhage. EXAM: PORTABLE CHEST 1 VIEW COMPARISON:  None. FINDINGS: Normal cardiac silhouette given projection and technique. Aortic atherosclerosis with calcification. Clear lungs. No pleural effusion or pneumothorax. Degenerative changes of the glenohumeral joints. No acute osseous abnormality is evident. IMPRESSION: No active disease.  Aortic atherosclerosis. Electronically Signed   By: Kristine Garbe M.D.   On: 12/05/2016 19:12    Assessment: 1. Left occipital lobe hemorrhagic mass. The appearance in the context of her prior history of breast cancer, is most consistent with a solitary metastasis. The appearance is atypical for a primary brain tumor such as astrocytoma or oligodendroglioma, but this possibility should also be considered.  2. Headache. Most likely secondary to the mass.    Recommendations 1. Given hemorrhagic nature of the mass, avoid anticoagulants. DVT prophylaxis with SCDs.  2. Neurosurgery consultation for possible biopsy.  3. Hematology/oncology consultation.  4. The patient should be advised not to drive, as her right sided hemianopsia puts her at risk for MVA.  5. Headache management.   Electronically signed: Dr. Kerney Elbe 12/05/2016, 7:22 PM

## 2016-12-05 NOTE — ED Provider Notes (Addendum)
[facility] DEPT Provider Note   CSN: [id number] Arrival date & time: 12/05/16  1353     History   Chief Complaint Chief Complaint  Patient presents with  . Headache    HPI Savannah Beltran is a 83 y.o. female.  HPI This is an 83 year old female who presents today with an MRI which reveals aSubacute greater than 2 cm hemorrhage LEFT occipital lobe, suspected secondary to a hemorrhagic metastasis, approximately 12 mm in diameter. Surrounding vasogenic edema .  Patient reports that she has had approximately one-week history of changes in her vision which involves both eyes. She states during that time she was seen by ophthalmologist thought that she might have a retinal detachment. She denies any lateralized weakness. She states she has had some headache on the right back side of her head. She has not had any trauma. She has a history of breast cancer with the right breast 30 years ago and the left breast 78 months ago. She states that she had a lumpectomy but no further treatment was deemed to be needed. She has not noted any cough, chest pain, change in weight, or night sweats.     Past Medical History:  Diagnosis Date  . Cancer [facility])    Breast Cancer  . Migraines     There are no active problems to display for this patient.   Past Surgical History:  Procedure Laterality Date  . BREAST SURGERY    . Partial hip replacement      OB History    No data available       Home Medications    Prior to Admission medications   Medication Sig Start Date End Date Taking? Authorizing Provider  carvedilol (COREG) 12.5 MG tablet Take 12.5 mg by mouth 2 (two) times daily with a meal.   Yes Historical Provider, MD  hydrALAZINE (APRESOLINE) 25 MG tablet Take 25-50 mg by mouth See admin instructions. 25 mg in the morning and 50 mg in the evening   Yes Historical Provider, MD  nortriptyline (PAMELOR) 25 MG capsule Take 25 mg by mouth at bedtime.   Yes Historical Provider, MD    butalbital-acetaminophen-caffeine (FIORICET, ESGIC) 50-325-40 MG tablet Take 1 tablet by mouth See admin instructions. Every 4-6 hours as needed for severe headaches 12/03/16   Historical Provider, MD  pravastatin (PRAVACHOL) 10 MG tablet Take 10 mg by mouth at bedtime. 10/19/16   Historical Provider, MD    Family History No family history on file.  Social History Social History  Substance Use Topics  . Smoking status: Never Smoker  . Smokeless tobacco: Never Used  . Alcohol use No     Allergies   Percocet [oxycodone-acetaminophen]   Review of Systems Review of Systems  All other systems reviewed and are negative.    Physical Exam Updated Vital Signs BP 128/73   Pulse 67   Temp 98.3 F (36.8 C) (Oral)   Resp 14   Ht 5\' 3"  (1.6 m)   Wt 66.7 kg   SpO2 100%   BMI 26.04 kg/m   Physical Exam  Constitutional: She is oriented to person, place, and time. She appears well-developed and well-nourished. No distress.  HENT:  Head: Normocephalic and atraumatic.  Right Ear: External ear normal.  Left Ear: External ear normal.  Nose: Nose normal.  Mouth/Throat: Oropharynx is clear and moist.  Eyes: Conjunctivae and EOM are normal. Pupils are equal, round, and reactive to light.  Neck: Normal range of motion. Neck supple.  Cardiovascular: Normal rate, regular rhythm, normal heart sounds and intact distal pulses.   Pulmonary/Chest: Effort normal and breath sounds normal.  No breast masses noted by palpation  Abdominal: Soft. Bowel sounds are normal.  Musculoskeletal: Normal range of motion. She exhibits no edema.  Neurological: She is alert and oriented to person, place, and time. She displays normal reflexes. No sensory deficit. She exhibits normal muscle tone. Coordination normal.  Facial asymmetry with decreased nasolabial fold on right noted but appears to move equally  Skin: Skin is warm. Capillary refill takes less than 2 seconds.  Psychiatric: She has a normal mood and  affect. Her behavior is normal. Judgment and thought content normal.  Nursing note and vitals reviewed.    ED Treatments / Results  Labs (all labs ordered are listed, but only abnormal results are displayed) Labs Reviewed  CBC - Abnormal; Notable for the following:       Result Value   Hemoglobin 11.7 (*)    MCH 25.4 (*)    All other components within normal limits  COMPREHENSIVE METABOLIC PANEL - Abnormal; Notable for the following:    Glucose, Bld 105 (*)    All other components within normal limits  URINALYSIS, ROUTINE W REFLEX MICROSCOPIC - Abnormal; Notable for the following:    Specific Gravity, Urine 1.004 (*)    All other components within normal limits  I-STAT CHEM 8, ED - Abnormal; Notable for the following:    Potassium 3.4 (*)    Glucose, Bld 102 (*)    All other components within normal limits  PROTIME-INR  APTT  DIFFERENTIAL  RAPID URINE DRUG SCREEN, HOSP PERFORMED  I-STAT TROPOININ, ED    EKG  EKG Interpretation  Date/Time:  Thursday December 05 2016 17:46:07 EST Ventricular Rate:  62 PR Interval:    QRS Duration: 112 QT Interval:  427 QTC Calculation: 434 R Axis:   -56 Text Interpretation:  Sinus rhythm Atrial premature complex Prolonged PR interval Left anterior fascicular block Anterior infarct, old Confirmed by Avagail Whittlesey MD, Andee Poles [id number]) on 12/05/2016 6:36:10 PM       Radiology No results found.  Procedures Procedures (including critical care time)  Medications Ordered in ED Medications  clevidipine (CLEVIPREX) infusion 0.5 mg/mL (8 mg/hr Intravenous Rate/Dose Change 12/05/16 1835)     Initial Impression / Assessment and Plan / ED Course  Patient had Cleviprex started with initial blood pressures elevated to 7. Current systolic blood pressure 0000000.  I have reviewed the triage vital signs and the nursing notes.  Pertinent labs & imaging results that were available during my care of the patient were reviewed by me and considered in my medical  decision making (see chart for details).    6:52 PM Discussed with Dr. Leonel Ramsay and they will be in to see.  7:16 PM Patient appears stable. I discussed that neuro hospitalist will be in to see and we will discuss further plan afterwards.   Dr. Cheral Marker  here and seeing patient  Discussed with Dr. Brett Albino on call for family practice and orders placed for step down bed  CRITICAL CARE Performed by: Telina Kleckley S Total critical care time: 30 minutes Critical care time was exclusive of separately billable procedures and treating other patients. Critical care was necessary to treat or prevent imminent or life-threatening deterioration. Critical care was time spent personally by me on the following activities: development of treatment plan with patient and/or surrogate as well as nursing, discussions with consultants, evaluation of patient's response to treatment, examination  of patient, obtaining history from patient or surrogate, ordering and performing treatments and interventions, ordering and review of laboratory studies, ordering and review of radiographic studies, pulse oximetry and re-evaluation of patient's condition.  Final Clinical Impressions(s) / ED Diagnoses   Final diagnoses:  Hypertension, unspecified type  Nontraumatic cortical hemorrhage of left cerebral hemisphere [facility])    New Prescriptions New Prescriptions   No medications on file     Pattricia Boss, MD 12/05/16 1951    Pattricia Boss, MD 12/05/16 [id number]    Pattricia Boss, MD 12/05/16 2025

## 2016-12-05 NOTE — ED Notes (Signed)
Alert, NAD, calm, interactive, resps e/u, speaking in clear complete sentences, no dyspnea noted, skin W&D, VSS, (denies: sob, nausea, dizziness). Family at [facility].

## 2016-12-05 NOTE — ED Triage Notes (Signed)
Per Pt, Pt is coming from home with complaints of intermittent headache and blurred vision that started a week ago. Pt reports going to PCP and being sent to get an MRI. Noted to have a brain tumor and some blood on the brain. Denies any other neuro deficits at this time.

## 2016-12-05 NOTE — ED Notes (Signed)
Awaiting meds from pharmacy  

## 2016-12-05 NOTE — ED Notes (Signed)
Attempted report to 4E, unable to take pt d/t cleviprex infusion

## 2016-12-06 ENCOUNTER — Inpatient Hospital Stay (HOSPITAL_COMMUNITY): Payer: Medicare HMO

## 2016-12-06 ENCOUNTER — Other Ambulatory Visit: Payer: Self-pay | Admitting: Neurosurgery

## 2016-12-06 ENCOUNTER — Encounter (HOSPITAL_COMMUNITY): Payer: Self-pay | Admitting: *Deleted

## 2016-12-06 ENCOUNTER — Other Ambulatory Visit (HOSPITAL_COMMUNITY): Payer: Self-pay | Admitting: Neurosurgery

## 2016-12-06 ENCOUNTER — Other Ambulatory Visit (HOSPITAL_COMMUNITY): Payer: Commercial Managed Care - HMO

## 2016-12-06 DIAGNOSIS — D496 Neoplasm of unspecified behavior of brain: Secondary | ICD-10-CM

## 2016-12-06 DIAGNOSIS — I10 Essential (primary) hypertension: Secondary | ICD-10-CM

## 2016-12-06 DIAGNOSIS — I619 Nontraumatic intracerebral hemorrhage, unspecified: Secondary | ICD-10-CM

## 2016-12-06 LAB — BASIC METABOLIC PANEL
ANION GAP: 12 (ref 5–15)
BUN: 8 mg/dL (ref 6–20)
CALCIUM: 9.1 mg/dL (ref 8.9–10.3)
CO2: 23 mmol/L (ref 22–32)
CREATININE: 0.77 mg/dL (ref 0.44–1.00)
Chloride: 100 mmol/L — ABNORMAL LOW (ref 101–111)
GFR calc Af Amer: >60 mL/min (ref >60)
GLUCOSE: 116 mg/dL — AB (ref 65–99)
Potassium: 3.2 mmol/L — ABNORMAL LOW (ref 3.5–5.1)
Sodium: 135 mmol/L (ref 135–145)

## 2016-12-06 LAB — CBC
HCT: 38.1 % (ref 36.0–46.0)
Hemoglobin: 12.6 g/dL (ref 12.0–15.0)
MCH: 26.1 pg (ref 26.0–34.0)
MCHC: 33.1 g/dL (ref 30.0–36.0)
MCV: 78.9 fL (ref 78.0–100.0)
PLATELETS: 264 10*3/uL (ref 150–400)
RBC: 4.83 MIL/uL (ref 3.87–5.11)
RDW: 14.5 % (ref 11.5–15.5)
WBC: 8.8 10*3/uL (ref 4.0–10.5)

## 2016-12-06 LAB — SURGICAL PCR SCREEN
MRSA, PCR: NEGATIVE
Staphylococcus aureus: POSITIVE — AB

## 2016-12-06 LAB — BRAIN NATRIURETIC PEPTIDE: B Natriuretic Peptide: 114.8 pg/mL — ABNORMAL HIGH (ref 0.0–100.0)

## 2016-12-06 MED ORDER — AMLODIPINE BESYLATE 2.5 MG PO TABS: 2.5000 mg | ORAL_TABLET | Freq: Every day | ORAL | 0 refills | Status: AC

## 2016-12-06 MED ORDER — CARVEDILOL 12.5 MG PO TABS
12.5000 mg | ORAL_TABLET | Freq: Two times a day (BID) | ORAL | Status: DC
  Administered 2016-12-06: 12.5 mg via ORAL
  Filled 2016-12-06: qty 1

## 2016-12-06 MED ORDER — AMLODIPINE BESYLATE 2.5 MG PO TABS
2.5000 mg | ORAL_TABLET | Freq: Every day | ORAL | Status: DC
  Administered 2016-12-06: 2.5 mg via ORAL
  Filled 2016-12-06: qty 1

## 2016-12-06 MED ORDER — IOPAMIDOL (ISOVUE-300) INJECTION 61%
INTRAVENOUS | Status: AC
  Administered 2016-12-06: 100 mL
  Filled 2016-12-06: qty 100

## 2016-12-06 MED ORDER — LEVETIRACETAM 500 MG PO TABS: 500.0000 mg | ORAL_TABLET | Freq: Two times a day (BID) | ORAL | 1 refills | Status: DC

## 2016-12-06 MED ORDER — ALUM & MAG HYDROXIDE-SIMETH 200-200-20 MG/5ML PO SUSP
15.0000 mL | Freq: Once | ORAL | Status: AC
  Administered 2016-12-06: 15 mL via ORAL

## 2016-12-06 MED ORDER — DEXAMETHASONE 4 MG PO TABS: 4.0000 mg | ORAL_TABLET | Freq: Two times a day (BID) | ORAL | 1 refills | Status: DC

## 2016-12-06 MED ORDER — POTASSIUM CHLORIDE CRYS ER 20 MEQ PO TBCR
40.0000 meq | EXTENDED_RELEASE_TABLET | Freq: Once | ORAL | Status: AC
  Administered 2016-12-06: 40 meq via ORAL
  Filled 2016-12-06: qty 2

## 2016-12-06 NOTE — Progress Notes (Signed)
Received a call from RN that patient would need to go to the ICU if she were to remain on the Cleviprex drip. Will transition her over to PO medications now so that she can go to stepdown instead. Discussed with pharmacy. Will give Norvasc 2.5mg  now, then stop the Cleviprex drip in 1 hour. If her blood pressures remain significantly elevated, will give another 2.5mg  of Norvasc.  Hyman Bible, MD PGY-2

## 2016-12-06 NOTE — Discharge Summary (Signed)
[facility] Discharge Summary  Patient name: Savannah Beltran Medical record number: [id number] Date of birth: Jan 24, 1934 Age: 83 y.o. Gender: female Date of Admission: 12/05/2016  Date of Discharge: 12/07/16  Admitting Physician: Leeanne Rio, MD  Primary Care Provider: Laverna Peace, NP Consultants: neurology, neurosurgery  Indication for Hospitalization: visual changes  Discharge Diagnoses/Problem List:  Brain metastasis with hemorrhage and surrounding vasogenic edema History of breast cancer Hypertensive urgency, resolved GERD HLD  Disposition: home  Discharge Condition: stable  Discharge Exam: see progress note from day of discharge  Brief Hospital Course:  Patient presented with seeing black spots off and on and dull headache since 2/20. PCP ordered MRI prior to admission, found to have > 2 cm hemorrhage in the L occipital lobe secondary to a hemorrhagic metastasis approximately 76mm in diameter with surrounding vasogenic edema. Hx of R breast cancer 30 years ago, treated with lumpectomy and lymph node removal and then was diagnosed with cancer of the left breast 8 months ago by routine mammogram. She had another lumpectomy 03/2016. 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 Only focal finding was right visual field defect. In ED, no additional imaging ordered. Neurology consulted from ED, who recommended neurosurgery consult and advised no driving. Hypertensive urgency requiring Cleviprex drip. Patient transitioned to Norvasc from Cleviprex drip and subsequently normotensive. Consulted neurosurgery, who recommended outpatient biopsy and further imaging to pursue primary tumor location. CT chest, abdomen, and pelvis did not reveal overt primary lesion. Slightly nodular appearance of the right middle lobe read as favoring benign, recommended repeat noncontrast chest CT at 3-6  months.   Issues for Follow Up:  1. Needs continued neurosurgical follow up. Possible biopsy scheduled 3/8. 2. Referral to medical oncology. Dr. Marin Olp to follow chart review and set up outpatient appointment with his office after tissue diagnosis.  3. Follow up right middle lobe nodular appearance with noncontrast chest CT in 3-6 months.   Significant Procedures:  CT chest, abdomen, pelvis with contrast  Significant Labs and Imaging:   Recent Labs Lab 12/05/16 1743 12/05/16 1756 12/06/16 0612  WBC 7.5  --  8.8  HGB 11.7* 12.9 12.6  HCT 36.4 38.0 38.1  PLT 270  --  264    Recent Labs Lab 12/05/16 1743 12/05/16 1756 12/06/16 0612  NA 139 139 135  K 3.5 3.4* 3.2*  CL 106 103 100*  CO2 27  --  23  GLUCOSE 105* 102* 116*  BUN 8 9 8   CREATININE 0.81 0.70 0.77  CALCIUM 9.5  --  9.1  ALKPHOS 45  --   --   AST 24  --   --   ALT 15  --   --   ALBUMIN 3.9  --   --     Results/Tests Pending at Time of Discharge: none  Discharge Medications:  Allergies as of 12/06/2016      Reactions   Ace Inhibitors Cough   Per [facility]   Amlodipine Other (See Comments)   Headache, per [facility]   Angiotensin Receptor Blockers Cough   Per [facility]   Percocet [oxycodone-acetaminophen] Other (See Comments)   "Worms crawling in" patient's head sensation      Medication List    STOP taking these medications   butalbital-acetaminophen-caffeine 50-325-40 MG tablet Commonly known as:  FIORICET, ESGIC   hydrALAZINE 25 MG tablet Commonly known as:  APRESOLINE  TAKE these medications   acetaminophen 500 MG tablet Commonly known as:  TYLENOL Take 1,000 mg by mouth every 6 (six) hours as needed (for headaches or pain).   amLODipine 2.5 MG tablet Commonly known as:  NORVASC Take 1 tablet (2.5 mg total) by mouth daily.   aspirin EC 81 MG tablet Take 81 mg by mouth daily.   carvedilol 12.5 MG tablet Commonly known as:  COREG Take 12.5 mg by mouth 2 (two) times daily with a meal.   COCONUT  OIL PO Take 15 mLs by mouth See admin instructions. Mixed with a drink once a day   Coenzyme Q10 100 MG capsule Take 200 mg by mouth 2 (two) times daily.   dexamethasone 4 MG tablet Commonly known as:  DECADRON Take 1 tablet (4 mg total) by mouth 2 (two) times daily with a meal.   Garlic Oil 123XX123 MG Caps Take 1,000 mg by mouth daily.   levETIRAcetam 500 MG tablet Commonly known as:  KEPPRA Take 1 tablet (500 mg total) by mouth every 12 (twelve) hours.   medium chain triglycerides oil Commonly known as:  MCT OIL Take 15 mLs by mouth every other day.   NEXIUM 24HR 20 MG Tbec Generic drug:  Esomeprazole Magnesium Take 1 tablet by mouth daily as needed (for reflux/indigestion).   nortriptyline 25 MG capsule Commonly known as:  PAMELOR Take 25 mg by mouth at bedtime.   omeprazole 20 MG tablet Commonly known as:  PRILOSEC OTC Take 20 mg by mouth daily as needed (for reflux/indigestion).   ONE-A-DAY WOMENS 50+ ADVANTAGE Tabs Take 1 tablet by mouth daily with breakfast.   pravastatin 10 MG tablet Commonly known as:  PRAVACHOL Take 10 mg by mouth at bedtime.   riboflavin 100 MG Tabs tablet Commonly known as:  VITAMIN B-2 Take 100 mg by mouth daily.   VITAMIN D-3 PO Take 1 capsule by mouth daily.   Vitamin-B Complex Tabs Take 1 tablet by mouth daily.       Discharge Instructions: Please refer to Patient Instructions section of EMR for full details.  Patient was counseled important signs and symptoms that should prompt return to medical care, changes in medications, dietary instructions, activity restrictions, and follow up appointments.   Follow-Up Appointments: Follow-up Information    CABBELL,KYLE L, MD Follow up today.   Specialty:  Neurosurgery Why:  we will call you to schedule your case.  Contact information: [address] [place] [address] [place] [address] [phone]          Patient and family instructed to follow up with PCP within the week.    Sela Hilding, MD 12/07/2016, 5:41 PM PGY-1, [place]

## 2016-12-06 NOTE — Progress Notes (Signed)
First bottle of PO contrast completed, second bottle of PO contrast given.

## 2016-12-06 NOTE — Progress Notes (Signed)
First bottle of PO contrast given.

## 2016-12-06 NOTE — Progress Notes (Signed)
Neurology sign off note  Subjective: Mild HA and some small black spots in her vision but otherwise no complainants.     Vitals:   12/06/16 1100 12/06/16 1216  BP: 128/81 138/71  Pulse: 79 66  Resp: (!) 23 18  Temp:  97.8 F (36.6 C)   Gen: In bed, NAD Resp: non-labored breathing, no acute distress Abd: soft, nt   General: NAD Mental Status: Alert, oriented, thought content appropriate.  Speech fluent without evidence of aphasia.  Able to follow 3 step commands without difficulty. Cranial Nerves: II:  Visual fields decreased vision in the right , pupils equal, round, reactive to light and accommodation III,IV, VI: ptosis not present, extra-ocular motions intact bilaterally V,VII: smile symmetric, facial light touch sensation normal bilaterally VIII: hearing normal bilaterally IX,X: uvula rises symmetrically XI: bilateral shoulder shrug XII: midline tongue extension without atrophy or fasciculations  Motor: Right : Upper extremity   5/5    Left:     Upper extremity   5/5  Lower extremity   5/5     Lower extremity   5/5 Tone and bulk:normal tone throughout; no atrophy noted Sensory: Pinprick and light touch intact throughout, bilaterally Deep Tendon Reflexes:  Right: Upper Extremity   Left: Upper extremity   biceps (C-5 to C-6) 2/4   biceps (C-5 to C-6) 2/4 tricep (C7) 2/4    triceps (C7) 2/4 Brachioradialis (C6) 2/4  Brachioradialis (C6) 2/4  Lower Extremity Lower Extremity  quadriceps (L-2 to L-4) 2/4   quadriceps (L-2 to L-4) 2/4 Achilles (S1) 2/4   Achilles (S1) 2/4  Plantars: Right:mute   Left: downgoing Cerebellar: normal finger-to-nose,  normal heel-to-shin test     Pertinent Labs: none  Brief summary of case/Impression: 83 y.o. female who presented with a one week history of intermittent headache and right-sided blurred vision. MRI was ordered, revealing a hemorrhagic tumor within the left occipital lobe. Neurosurgery has evaluated and will  discharge with keppra and decadron. Will schedule resection next week.  Neurological Diagnoses: 1) hemorrhagic tumor within the left occipital lobe  Recommendations: 1) no further neurology recommendations at this time. Thank you for the consult. Further recommendations per neurosurgery.  2) Neurology to sign off at this time. Please call with any further questions or concerns.

## 2016-12-06 NOTE — Progress Notes (Signed)
Family Medicine Teaching Service Daily Progress Note Intern Pager: [phone]  Patient name: Savannah Beltran Medical record number: [id number] Date of birth: [birth year] Age: 83 y.o. Gender: female  Primary Care Provider: Laverna Peace, NP Consultants: neurology  Code Status: FULL  Pt Overview and Major Events to Date:  3/1 - admitted with new brain mass, required cleviprex drip for BP  Assessment and Plan: Savannah Beltran is a 83 y.o. female presenting with changes in vision, found to have an intracranial hemorrhage 2/2 a brain metastasis. PMH is significant for breast cancer, migraines.  Brain Metastasis: Pt with vision changes and dull headache for the last few weeks. PCP performed MRI, which showed a > 2 cm hemorrhage in the L occipital lobe secondary to a hemorrhagic metastasis approximately 58mm in diameter with surrounding vasogenic edema. No other focal neurological deficits. Pt has a history of R breast cancer 30 years ago that was cured with lumpectomy and new left brain cancer diagnosed 8 months ago that she was told was a ductal carcinoma in situ and would not metastasize.  - Neurology consulted, appreciate recommendations - consulted Neurosurgery for possible brain biopsy: discussed with Dr. Christella Beltran who recommended outpatient surgery next week - consult heme/onc - Neuro checks every 4 hours x 24 hours - Seizure precautions - Tylenol prn for headache - CT chest, abd, pelvis today to investigate primary tumor source  Hypertensive Urgency: BP 212/103 in the ED. Started on Cleviprex, which normalized pressures. Takes Coreg 12.5 bid and Hydralazine 25-50mg  bid at home. Pt has allergies to ACE/ARB and Norvasc. - no hydralazine given risk for worsening cerebral edema - Restarted Coreg 12.5mg  bid - Monitor BPs closely  Mild Volume Overload / Cardiac Murmur: Pt noted to have symmetric 1+ edema to the mid shins bilaterally and mild JVD. No crackles auscultated on lung exam. She has a  III/VI systolic heart murmur heard loudest in the RUSB. CXR clear. She has never had an ECHO in our records. -BNP mildly elevated at 115, will recommend echo with PCP as outpatient  GERD: Takes Prilosec and Nexium at home. - Protonix 40mg  daily prn while hospitalized  HLD: - Continue home Pravastatin 10mg   FEN/GI: NPO for possible procedure in the morning. Prophylaxis: SCDs  Disposition: possible discharge pending BP control  Subjective:  Savannah Beltran feels well this morning. Persistent vision changes, no pain or other symptoms. Would like to go home if possible  Objective: Temp:  [97.9 F (36.6 C)-98.3 F (36.8 C)] 97.9 F (36.6 C) (03/01 2156) Pulse Rate:  [56-88] 75 (03/02 0641) Resp:  [9-28] 16 (03/02 0641) BP: (96-212)/(60-103) 132/79 (03/02 0638) SpO2:  [92 %-100 %] 99 % (03/02 0641) Weight:  [141 lb (64 kg)-147 lb (66.7 kg)] 141 lb (64 kg) (03/02 0641) Physical Exam: General: Well-appearing, pleasant, talkative, in NAD Eyes: EOMI, PERRLA Cardiovascular: RRR, III/VI systolic murmur heard loudest in the RUSB Respiratory: CTAB, no crackles Gastrointestinal: +BS, soft, non-tender, non-distended MSK: no edema, warm and well-perfused Neuro: Awake, alert, oriented, CN II-XII intact, 5/5 muscle strength in the upper and lower extremities bilaterally, sensation intact to light touch throughout, reflexes 2+ and symmetric, normal finger-to-nose testing. Psych: Appropriate affect, normal behavior Laboratory:  Recent Labs Lab 12/05/16 1743 12/05/16 1756 12/06/16 0612  WBC 7.5  --  8.8  HGB 11.7* 12.9 12.6  HCT 36.4 38.0 38.1  PLT 270  --  264    Recent Labs Lab 12/05/16 1743 12/05/16 1756  NA 139 139  K 3.5 3.4*  CL 106  103  CO2 27  --   BUN 8 9  CREATININE 0.81 0.70  CALCIUM 9.5  --   PROT 6.8  --   BILITOT 0.7  --   ALKPHOS 45  --   ALT 15  --   AST 24  --   GLUCOSE 105* 102*    Imaging/Diagnostic Tests: MRI at another facility  Savannah Hilding, MD 12/06/2016, 7:05 AM PGY-1, [place] Intern pager: [phone], text pages welcome

## 2016-12-06 NOTE — ED Notes (Signed)
No changes. Alert, NAD, calm, interactive, resps e/u, speaking in clear complete sentences, no dyspnea noted, skin W&D, VSS, (denies: pain, HA, indigestion, sob, nausea, dizziness).

## 2016-12-06 NOTE — Consult Note (Signed)
Reason for Consult:brain tumor Referring Physician: vara, Savannah Beltran is an 83 y.o. female.  HPI: whom was admitted yesterday due to finding on MRI brain of a hemorrhagic lesion in the left occipital pole. She has been having difficulty with her vision, and presented with malignant hypertension in the ED yesterday. Treated with cleviprex her blood pressure is now controlled. The tumor's origin is not clear given history of breast cancer v a new primary malignancy v primary brain tumor. Currently doing very well I have been asked to provide treatment for the mass. There is no shift, nor focal deficit other than right visual field.   Past Medical History:  Diagnosis Date  . Cancer [facility])    Breast Cancer  . Migraines     Past Surgical History:  Procedure Laterality Date  . BREAST SURGERY    . Partial hip replacement      History reviewed. No pertinent family history.  Social History:  reports that she has never smoked. She has never used smokeless tobacco. She reports that she does not drink alcohol or use drugs.  Allergies:  Allergies  Allergen Reactions  . Ace Inhibitors Cough    Per [facility]  . Amlodipine Other (See Comments)    Headache, per [facility]  . Angiotensin Receptor Blockers Cough    Per [facility]  . Percocet [Oxycodone-Acetaminophen] Other (See Comments)    "Worms crawling in" patient's head sensation    Medications: I have reviewed the patient's current medications.  Results for orders placed or performed during the hospital encounter of 12/05/16 (from the past 48 hour(s))  Protime-INR     Status: None   Collection Time: 12/05/16  5:43 PM  Result Value Ref Range   Prothrombin Time 13.0 11.4 - 15.2 seconds   INR 0.98   APTT     Status: None   Collection Time: 12/05/16  5:43 PM  Result Value Ref Range   aPTT 33 24 - 36 seconds  CBC     Status: Abnormal   Collection Time: 12/05/16  5:43 PM  Result Value Ref Range   WBC 7.5 4.0 - 10.5 K/uL   RBC 4.60 3.87 -  5.11 MIL/uL   Hemoglobin 11.7 (L) 12.0 - 15.0 g/dL   HCT 36.4 36.0 - 46.0 %   MCV 79.1 78.0 - 100.0 fL   MCH 25.4 (L) 26.0 - 34.0 pg   MCHC 32.1 30.0 - 36.0 g/dL   RDW 14.4 11.5 - 15.5 %   Platelets 270 150 - 400 K/uL  Differential     Status: None   Collection Time: 12/05/16  5:43 PM  Result Value Ref Range   Neutrophils Relative % 45 %   Neutro Abs 3.4 1.7 - 7.7 K/uL   Lymphocytes Relative 41 %   Lymphs Abs 3.1 0.7 - 4.0 K/uL   Monocytes Relative 10 %   Monocytes Absolute 0.8 0.1 - 1.0 K/uL   Eosinophils Relative 3 %   Eosinophils Absolute 0.3 0.0 - 0.7 K/uL   Basophils Relative 1 %   Basophils Absolute 0.1 0.0 - 0.1 K/uL  Comprehensive metabolic panel     Status: Abnormal   Collection Time: 12/05/16  5:43 PM  Result Value Ref Range   Sodium 139 135 - 145 mmol/L   Potassium 3.5 3.5 - 5.1 mmol/L   Chloride 106 101 - 111 mmol/L   CO2 27 22 - 32 mmol/L   Glucose, Bld 105 (H) 65 - 99 mg/dL   BUN 8  6 - 20 mg/dL   Creatinine, Ser 0.81 0.44 - 1.00 mg/dL   Calcium 9.5 8.9 - 10.3 mg/dL   Total Protein 6.8 6.5 - 8.1 g/dL   Albumin 3.9 3.5 - 5.0 g/dL   AST 24 15 - 41 U/L   ALT 15 14 - 54 U/L   Alkaline Phosphatase 45 38 - 126 U/L   Total Bilirubin 0.7 0.3 - 1.2 mg/dL   GFR calc non Af Amer >60 >60 mL/min   GFR calc Af Amer >60 >60 mL/min    Comment: (NOTE) The eGFR has been calculated using the CKD EPI equation. This calculation has not been validated in all clinical situations. eGFR's persistently <60 mL/min signify possible Chronic Kidney Disease.    Anion gap 6 5 - 15  Urine rapid drug screen (hosp performed)not at [facility]     Status: Abnormal   Collection Time: 12/05/16  5:43 PM  Result Value Ref Range   Opiates NONE DETECTED NONE DETECTED   Cocaine NONE DETECTED NONE DETECTED   Benzodiazepines NONE DETECTED NONE DETECTED   Amphetamines NONE DETECTED NONE DETECTED   Tetrahydrocannabinol NONE DETECTED NONE DETECTED   Barbiturates POSITIVE (A) NONE DETECTED    Comment:         DRUG SCREEN FOR MEDICAL PURPOSES ONLY.  IF CONFIRMATION IS NEEDED FOR ANY PURPOSE, NOTIFY LAB WITHIN 5 DAYS.        LOWEST DETECTABLE LIMITS FOR URINE DRUG SCREEN Drug Class       Cutoff (ng/mL) Amphetamine      1000 Barbiturate      200 Benzodiazepine   295 Tricyclics       621 Opiates          300 Cocaine          300 THC              50   Urinalysis, Routine w reflex microscopic     Status: Abnormal   Collection Time: 12/05/16  5:43 PM  Result Value Ref Range   Color, Urine YELLOW YELLOW   APPearance CLEAR CLEAR   Specific Gravity, Urine 1.004 (L) 1.005 - 1.030   pH 7.0 5.0 - 8.0   Glucose, UA NEGATIVE NEGATIVE mg/dL   Hgb urine dipstick NEGATIVE NEGATIVE   Bilirubin Urine NEGATIVE NEGATIVE   Ketones, ur NEGATIVE NEGATIVE mg/dL   Protein, ur NEGATIVE NEGATIVE mg/dL   Nitrite NEGATIVE NEGATIVE   Leukocytes, UA NEGATIVE NEGATIVE  I-stat troponin, ED (not at [facility])     Status: None   Collection Time: 12/05/16  5:54 PM  Result Value Ref Range   Troponin i, poc 0.00 0.00 - 0.08 ng/mL   Comment 3            Comment: Due to the release kinetics of cTnI, a negative result within the first hours of the onset of symptoms does not rule out myocardial infarction with certainty. If myocardial infarction is still suspected, repeat the test at appropriate intervals.   I-Stat Chem 8, ED  (not at [facility], [facility])     Status: Abnormal   Collection Time: 12/05/16  5:56 PM  Result Value Ref Range   Sodium 139 135 - 145 mmol/L   Potassium 3.4 (L) 3.5 - 5.1 mmol/L   Chloride 103 101 - 111 mmol/L   BUN 9 6 - 20 mg/dL   Creatinine, Ser 0.70 0.44 - 1.00 mg/dL   Glucose, Bld 102 (H) 65 - 99 mg/dL   Calcium, Ion 1.16  1.15 - 1.40 mmol/L   TCO2 26 0 - 100 mmol/L   Hemoglobin 12.9 12.0 - 15.0 g/dL   HCT 38.0 36.0 - 48.5 %  Basic metabolic panel     Status: Abnormal   Collection Time: 12/06/16  6:12 AM  Result Value Ref Range   Sodium 135 135 - 145 mmol/L   Potassium 3.2 (L) 3.5 -  5.1 mmol/L   Chloride 100 (L) 101 - 111 mmol/L   CO2 23 22 - 32 mmol/L   Glucose, Bld 116 (H) 65 - 99 mg/dL   BUN 8 6 - 20 mg/dL   Creatinine, Ser 0.77 0.44 - 1.00 mg/dL   Calcium 9.1 8.9 - 10.3 mg/dL   GFR calc non Af Amer >60 >60 mL/min   GFR calc Af Amer >60 >60 mL/min    Comment: (NOTE) The eGFR has been calculated using the CKD EPI equation. This calculation has not been validated in all clinical situations. eGFR's persistently <60 mL/min signify possible Chronic Kidney Disease.    Anion gap 12 5 - 15  CBC     Status: None   Collection Time: 12/06/16  6:12 AM  Result Value Ref Range   WBC 8.8 4.0 - 10.5 K/uL   RBC 4.83 3.87 - 5.11 MIL/uL   Hemoglobin 12.6 12.0 - 15.0 g/dL   HCT 38.1 36.0 - 46.0 %   MCV 78.9 78.0 - 100.0 fL   MCH 26.1 26.0 - 34.0 pg   MCHC 33.1 30.0 - 36.0 g/dL   RDW 14.5 11.5 - 15.5 %   Platelets 264 150 - 400 K/uL  Brain natriuretic peptide     Status: Abnormal   Collection Time: 12/06/16  6:13 AM  Result Value Ref Range   B Natriuretic Peptide 114.8 (H) 0.0 - 100.0 pg/mL  Surgical pcr screen     Status: Abnormal   Collection Time: 12/06/16  6:38 AM  Result Value Ref Range   MRSA, PCR NEGATIVE NEGATIVE   Staphylococcus aureus POSITIVE (A) NEGATIVE    Comment:        The Xpert SA Assay (FDA approved for NASAL specimens in patients over 51 years of age), is one component of a comprehensive surveillance program.  Test performance has been validated by [facility] for patients greater than or equal to 44 year old. It is not intended to diagnose infection nor to guide or monitor treatment.     Dg Chest Port 1 View  Result Date: 12/05/2016 CLINICAL DATA:  83 y/o  F; intracranial hemorrhage. EXAM: PORTABLE CHEST 1 VIEW COMPARISON:  None. FINDINGS: Normal cardiac silhouette given projection and technique. Aortic atherosclerosis with calcification. Clear lungs. No pleural effusion or pneumothorax. Degenerative changes of the glenohumeral joints. No  acute osseous abnormality is evident. IMPRESSION: No active disease.  Aortic atherosclerosis. Electronically Signed   By: Kristine Garbe M.D.   On: 12/05/2016 19:12    Review of Systems  Constitutional: Negative.   HENT: Negative.   Eyes: Positive for blurred vision.  Respiratory: Negative.   Cardiovascular: Negative.   Gastrointestinal: Negative.   Genitourinary: Negative.   Musculoskeletal: Negative.   Skin: Negative.   Neurological: Negative.   Endo/Heme/Allergies: Negative.   Psychiatric/Behavioral: Negative.    Blood pressure 138/71, pulse 66, temperature 97.8 F (36.6 C), temperature source Oral, resp. rate 18, height 5' 3"  (1.6 m), weight 64 kg (141 lb), SpO2 97 %. Physical Exam  Constitutional: She is oriented to person, place, and time. She appears well-developed and well-nourished.  No distress.  HENT:  Head: Normocephalic and atraumatic.  Right Ear: External ear normal.  Left Ear: External ear normal.  Nose: Nose normal.  Mouth/Throat: Oropharynx is clear and moist. No oropharyngeal exudate.  Eyes: Conjunctivae and EOM are normal. Pupils are equal, round, and reactive to light. Right eye exhibits no discharge. Left eye exhibits no discharge.  Decreased right visual field. Do not believe this is a complete hemianopsia, but close.   Neck: Normal range of motion. Neck supple.  Cardiovascular: Normal rate, regular rhythm, normal heart sounds and intact distal pulses.   Respiratory: Effort normal and breath sounds normal.  GI: Soft. Bowel sounds are normal.  Musculoskeletal: Normal range of motion.  Neurological: She is alert and oriented to person, place, and time. She has normal strength and normal reflexes. She displays normal reflexes. A cranial nerve deficit is present. No sensory deficit. She exhibits normal muscle tone. She displays a negative Romberg sign. Coordination normal. GCS eye subscore is 4. GCS verbal subscore is 5. GCS motor subscore is 6. She  displays no Babinski's sign on the right side. She displays no Babinski's sign on the left side.  Reflex Scores:      Tricep reflexes are 2+ on the right side and 2+ on the left side.      Bicep reflexes are 2+ on the right side and 2+ on the left side.      Brachioradialis reflexes are 2+ on the right side and 2+ on the left side.      Patellar reflexes are 2+ on the right side and 2+ on the left side.      Achilles reflexes are 2+ on the right side and 2+ on the left side. Skin: Skin is dry. She is not diaphoretic.  Psychiatric: Her behavior is normal. Judgment and thought content normal.    Assessment/Plan: Will discharge with keppra and decadron. Will schedule resection next week. I have shown the radiographic studies to the family.   Santa Abdelrahman L 12/06/2016, 12:51 PM

## 2016-12-10 ENCOUNTER — Encounter (HOSPITAL_COMMUNITY): Payer: Self-pay

## 2016-12-10 ENCOUNTER — Ambulatory Visit (HOSPITAL_COMMUNITY)
Admission: RE | Admit: 2016-12-10 | Discharge: 2016-12-10 | Disposition: A | Discharge status: Home / Self Care | Payer: Medicare HMO | Source: Ambulatory Visit | Attending: Neurosurgery | Admitting: Neurosurgery

## 2016-12-10 DIAGNOSIS — C7931 Secondary malignant neoplasm of brain: Secondary | ICD-10-CM | POA: Diagnosis not present

## 2016-12-10 DIAGNOSIS — D496 Neoplasm of unspecified behavior of brain: Secondary | ICD-10-CM

## 2016-12-10 DIAGNOSIS — H539 Unspecified visual disturbance: Secondary | ICD-10-CM | POA: Diagnosis present

## 2016-12-10 DIAGNOSIS — Z96649 Presence of unspecified artificial hip joint: Secondary | ICD-10-CM | POA: Diagnosis present

## 2016-12-10 DIAGNOSIS — I611 Nontraumatic intracerebral hemorrhage in hemisphere, cortical: Secondary | ICD-10-CM | POA: Diagnosis present

## 2016-12-10 DIAGNOSIS — I619 Nontraumatic intracerebral hemorrhage, unspecified: Secondary | ICD-10-CM | POA: Diagnosis not present

## 2016-12-10 DIAGNOSIS — I10 Essential (primary) hypertension: Secondary | ICD-10-CM | POA: Diagnosis present

## 2016-12-10 DIAGNOSIS — Z853 Personal history of malignant neoplasm of breast: Secondary | ICD-10-CM | POA: Diagnosis not present

## 2016-12-10 DIAGNOSIS — C719 Malignant neoplasm of brain, unspecified: Secondary | ICD-10-CM | POA: Diagnosis not present

## 2016-12-10 DIAGNOSIS — Z452 Encounter for adjustment and management of vascular access device: Secondary | ICD-10-CM | POA: Diagnosis not present

## 2016-12-10 MED ORDER — IOPAMIDOL (ISOVUE-300) INJECTION 61%
INTRAVENOUS | Status: AC
  Administered 2016-12-10: 75 mL via INTRAVENOUS
  Filled 2016-12-10: qty 75

## 2016-12-11 ENCOUNTER — Ambulatory Visit (HOSPITAL_COMMUNITY)
Admission: RE | Admit: 2016-12-11 | Discharge: 2016-12-11 | Disposition: A | Discharge status: Home / Self Care | Payer: Medicare HMO | Source: Ambulatory Visit | Attending: Neurosurgery | Admitting: Neurosurgery

## 2016-12-11 ENCOUNTER — Encounter (HOSPITAL_COMMUNITY): Payer: Self-pay | Admitting: *Deleted

## 2016-12-11 ENCOUNTER — Other Ambulatory Visit (HOSPITAL_COMMUNITY): Payer: Self-pay | Admitting: Neurosurgery

## 2016-12-11 DIAGNOSIS — I6782 Cerebral ischemia: Secondary | ICD-10-CM

## 2016-12-11 DIAGNOSIS — C719 Malignant neoplasm of brain, unspecified: Secondary | ICD-10-CM | POA: Diagnosis not present

## 2016-12-11 DIAGNOSIS — D496 Neoplasm of unspecified behavior of brain: Secondary | ICD-10-CM

## 2016-12-11 MED ORDER — GADOBENATE DIMEGLUMINE 529 MG/ML IV SOLN
13.0000 mL | Freq: Once | INTRAVENOUS | Status: AC | PRN
  Administered 2016-12-11: 13 mL via INTRAVENOUS

## 2016-12-11 NOTE — Progress Notes (Signed)
Pt denies SOB, chest pain, and being under the care of a cardiologist. Pt denies having a stress test, echo and cardiac cath. Pt stated that her last dose of Aspirin was Monday as instructed by MD. Pt made aware to stop taking vitamins, fish oil, and herbal medications (Coconut Oil, CO Q 10, Garlic). Do not take any NSAIDs ie: Ibuprofen, Advil, Naproxen, BC and Goody Powder or any medication containing Aspirin. Pt verbalized understanding of all pre-op instructions.

## 2016-12-12 ENCOUNTER — Inpatient Hospital Stay (HOSPITAL_COMMUNITY): Payer: Medicare HMO | Admitting: Anesthesiology

## 2016-12-12 ENCOUNTER — Encounter (HOSPITAL_COMMUNITY): Payer: Self-pay

## 2016-12-12 ENCOUNTER — Inpatient Hospital Stay (HOSPITAL_COMMUNITY): Payer: Medicare HMO

## 2016-12-12 ENCOUNTER — Inpatient Hospital Stay (HOSPITAL_COMMUNITY)
Admission: RE | Admit: 2016-12-12 | Discharge: 2016-12-13 | DRG: 024 | Disposition: A | Discharge status: Home / Self Care | Payer: Medicare HMO | Source: Ambulatory Visit | Attending: Neurosurgery | Admitting: Neurosurgery

## 2016-12-12 ENCOUNTER — Encounter (HOSPITAL_COMMUNITY)
Admission: RE | Disposition: A | Discharge status: Home / Self Care | Payer: Self-pay | Source: Ambulatory Visit | Attending: Neurosurgery

## 2016-12-12 DIAGNOSIS — I10 Essential (primary) hypertension: Secondary | ICD-10-CM | POA: Diagnosis present

## 2016-12-12 DIAGNOSIS — Z853 Personal history of malignant neoplasm of breast: Secondary | ICD-10-CM

## 2016-12-12 DIAGNOSIS — D496 Neoplasm of unspecified behavior of brain: Secondary | ICD-10-CM | POA: Diagnosis present

## 2016-12-12 DIAGNOSIS — Z452 Encounter for adjustment and management of vascular access device: Secondary | ICD-10-CM

## 2016-12-12 DIAGNOSIS — I611 Nontraumatic intracerebral hemorrhage in hemisphere, cortical: Secondary | ICD-10-CM | POA: Diagnosis present

## 2016-12-12 DIAGNOSIS — Z96649 Presence of unspecified artificial hip joint: Secondary | ICD-10-CM | POA: Diagnosis present

## 2016-12-12 DIAGNOSIS — I619 Nontraumatic intracerebral hemorrhage, unspecified: Secondary | ICD-10-CM | POA: Diagnosis present

## 2016-12-12 DIAGNOSIS — H539 Unspecified visual disturbance: Secondary | ICD-10-CM | POA: Diagnosis present

## 2016-12-12 HISTORY — DX: Anemia, unspecified: D64.9

## 2016-12-12 HISTORY — DX: Nausea with vomiting, unspecified: R11.2

## 2016-12-12 HISTORY — DX: Nausea with vomiting, unspecified: Z98.890

## 2016-12-12 HISTORY — DX: Neoplasm of unspecified behavior of brain: D49.6

## 2016-12-12 HISTORY — PX: CRANIOTOMY: SHX93

## 2016-12-12 HISTORY — PX: APPLICATION OF CRANIAL NAVIGATION: SHX6578

## 2016-12-12 HISTORY — DX: Heartburn: R12

## 2016-12-12 HISTORY — DX: Cardiac murmur, unspecified: R01.1

## 2016-12-12 LAB — TYPE AND SCREEN
ABO/RH(D): A NEG
ANTIBODY SCREEN: NEGATIVE

## 2016-12-12 LAB — ABO/RH: ABO/RH(D): A NEG

## 2016-12-12 SURGERY — CRANIOTOMY TUMOR EXCISION
Anesthesia: General | Site: Head | Laterality: Left

## 2016-12-12 MED ORDER — HYDROCODONE-ACETAMINOPHEN 5-325 MG PO TABS: 1.0000 | ORAL_TABLET | ORAL | Status: DC | PRN

## 2016-12-12 MED ORDER — MORPHINE SULFATE (PF) 2 MG/ML IV SOLN: 1.0000 mg | INTRAVENOUS | Status: DC | PRN

## 2016-12-12 MED ORDER — SUGAMMADEX SODIUM 200 MG/2ML IV SOLN
INTRAVENOUS | Status: DC | PRN
  Administered 2016-12-12: 120 mg via INTRAVENOUS

## 2016-12-12 MED ORDER — ONDANSETRON HCL 4 MG/2ML IJ SOLN
INTRAMUSCULAR | Status: AC
  Filled 2016-12-12: qty 2

## 2016-12-12 MED ORDER — PRAVASTATIN SODIUM 20 MG PO TABS
10.0000 mg | ORAL_TABLET | Freq: Every day | ORAL | Status: DC
  Administered 2016-12-12: 10 mg via ORAL
  Filled 2016-12-12: qty 1

## 2016-12-12 MED ORDER — HYDRALAZINE HCL 20 MG/ML IJ SOLN
5.0000 mg | Freq: Once | INTRAMUSCULAR | Status: AC
  Administered 2016-12-12: 5 mg via INTRAVENOUS

## 2016-12-12 MED ORDER — ACETAMINOPHEN 325 MG PO TABS: 650.0000 mg | ORAL_TABLET | ORAL | Status: DC | PRN

## 2016-12-12 MED ORDER — NICARDIPINE HCL IN NACL 20-0.86 MG/200ML-% IV SOLN: 3.0000 mg/h | INTRAVENOUS | Status: DC

## 2016-12-12 MED ORDER — DEXAMETHASONE 4 MG PO TABS
4.0000 mg | ORAL_TABLET | Freq: Three times a day (TID) | ORAL | Status: AC
  Administered 2016-12-12 – 2016-12-13 (×3): 4 mg via ORAL
  Filled 2016-12-12 (×3): qty 1

## 2016-12-12 MED ORDER — MUPIROCIN 2 % EX OINT
1.0000 | TOPICAL_OINTMENT | Freq: Once | CUTANEOUS | Status: AC
Start: 2016-12-12 — End: 2016-12-12
  Administered 2016-12-12: 1 via TOPICAL

## 2016-12-12 MED ORDER — MAGNESIUM CITRATE PO SOLN: 1.0000 | Freq: Once | ORAL | Status: DC | PRN

## 2016-12-12 MED ORDER — SURGIFOAM 100 EX MISC
CUTANEOUS | Status: DC | PRN
  Administered 2016-12-12: 17:00:00 via TOPICAL

## 2016-12-12 MED ORDER — ONDANSETRON HCL 4 MG/2ML IJ SOLN
INTRAMUSCULAR | Status: DC | PRN
  Administered 2016-12-12: 4 mg via INTRAVENOUS

## 2016-12-12 MED ORDER — EPHEDRINE 5 MG/ML INJ
INTRAVENOUS | Status: AC
  Filled 2016-12-12: qty 20

## 2016-12-12 MED ORDER — FENTANYL CITRATE (PF) 100 MCG/2ML IJ SOLN
INTRAMUSCULAR | Status: AC
  Filled 2016-12-12: qty 2

## 2016-12-12 MED ORDER — LABETALOL HCL 5 MG/ML IV SOLN
INTRAVENOUS | Status: AC
  Administered 2016-12-12: 10 mg via INTRAVENOUS
  Filled 2016-12-12: qty 4

## 2016-12-12 MED ORDER — LACTATED RINGERS IV SOLN: INTRAVENOUS | Status: DC

## 2016-12-12 MED ORDER — THROMBIN 20000 UNITS EX SOLR
CUTANEOUS | Status: AC
  Filled 2016-12-12: qty 20000

## 2016-12-12 MED ORDER — ROCURONIUM BROMIDE 50 MG/5ML IV SOSY
PREFILLED_SYRINGE | INTRAVENOUS | Status: AC
  Filled 2016-12-12: qty 5

## 2016-12-12 MED ORDER — DOCUSATE SODIUM 100 MG PO CAPS
100.0000 mg | ORAL_CAPSULE | Freq: Two times a day (BID) | ORAL | Status: DC
  Administered 2016-12-12 – 2016-12-13 (×2): 100 mg via ORAL
  Filled 2016-12-12 (×2): qty 1

## 2016-12-12 MED ORDER — ESOMEPRAZOLE MAGNESIUM 20 MG PO TBEC: 1.0000 | DELAYED_RELEASE_TABLET | Freq: Every day | ORAL | Status: DC | PRN

## 2016-12-12 MED ORDER — POTASSIUM CHLORIDE IN NACL 20-0.9 MEQ/L-% IV SOLN
INTRAVENOUS | Status: DC
  Administered 2016-12-12 – 2016-12-13 (×2): via INTRAVENOUS
  Filled 2016-12-12 (×3): qty 1000

## 2016-12-12 MED ORDER — FENTANYL CITRATE (PF) 100 MCG/2ML IJ SOLN: 50.0000 ug | INTRAMUSCULAR | Status: DC | PRN

## 2016-12-12 MED ORDER — HYDRALAZINE HCL 20 MG/ML IJ SOLN
5.0000 mg | Freq: Once | INTRAMUSCULAR | Status: AC
  Administered 2016-12-12: 5 mg via INTRAVENOUS
  Filled 2016-12-12: qty 1

## 2016-12-12 MED ORDER — NITROGLYCERIN 0.2 MG/ML ON CALL CATH LAB
INTRAVENOUS | Status: DC | PRN
  Administered 2016-12-12: 40 ug via INTRAVENOUS
  Administered 2016-12-12 (×4): 80 ug via INTRAVENOUS
  Administered 2016-12-12: 40 ug via INTRAVENOUS
  Administered 2016-12-12: 80 ug via INTRAVENOUS

## 2016-12-12 MED ORDER — SODIUM CHLORIDE 0.9 % IV SOLN
INTRAVENOUS | Status: DC
  Administered 2016-12-12: 11:00:00 via INTRAVENOUS

## 2016-12-12 MED ORDER — B COMPLEX-C PO TABS
1.0000 | ORAL_TABLET | Freq: Every day | ORAL | Status: DC
  Administered 2016-12-13: 1 via ORAL
  Filled 2016-12-12: qty 1

## 2016-12-12 MED ORDER — VITAMIN B-2 100 MG PO TABS
100.0000 mg | ORAL_TABLET | Freq: Every day | ORAL | Status: DC
Start: 2016-12-12 — End: 2016-12-12

## 2016-12-12 MED ORDER — VITAMIN D 1000 UNITS PO TABS
1000.0000 [IU] | ORAL_TABLET | Freq: Every day | ORAL | Status: DC
  Administered 2016-12-12 – 2016-12-13 (×2): 1000 [IU] via ORAL
  Filled 2016-12-12 (×2): qty 1

## 2016-12-12 MED ORDER — ROCURONIUM BROMIDE 10 MG/ML (PF) SYRINGE
PREFILLED_SYRINGE | INTRAVENOUS | Status: DC | PRN
  Administered 2016-12-12: 20 mg via INTRAVENOUS
  Administered 2016-12-12: 50 mg via INTRAVENOUS

## 2016-12-12 MED ORDER — LABETALOL HCL 5 MG/ML IV SOLN
10.0000 mg | INTRAVENOUS | Status: DC | PRN
  Administered 2016-12-12: 10 mg via INTRAVENOUS

## 2016-12-12 MED ORDER — FENTANYL CITRATE (PF) 100 MCG/2ML IJ SOLN
INTRAMUSCULAR | Status: DC | PRN
  Administered 2016-12-12 (×3): 50 ug via INTRAVENOUS
  Administered 2016-12-12: 100 ug via INTRAVENOUS

## 2016-12-12 MED ORDER — ACETAMINOPHEN 650 MG RE SUPP: 650.0000 mg | RECTAL | Status: DC | PRN

## 2016-12-12 MED ORDER — SENNOSIDES-DOCUSATE SODIUM 8.6-50 MG PO TABS: 1.0000 | ORAL_TABLET | Freq: Every evening | ORAL | Status: DC | PRN

## 2016-12-12 MED ORDER — HYDRALAZINE HCL 20 MG/ML IJ SOLN
5.0000 mg | Freq: Once | INTRAMUSCULAR | Status: AC
  Administered 2016-12-12: 10 mg via INTRAVENOUS

## 2016-12-12 MED ORDER — PROPOFOL 10 MG/ML IV BOLUS
INTRAVENOUS | Status: DC | PRN
  Administered 2016-12-12: 30 mg via INTRAVENOUS
  Administered 2016-12-12: 140 mg via INTRAVENOUS

## 2016-12-12 MED ORDER — NALOXONE HCL 0.4 MG/ML IJ SOLN
0.0800 mg | INTRAMUSCULAR | Status: DC | PRN
Start: 2016-12-12 — End: 2016-12-13

## 2016-12-12 MED ORDER — AMLODIPINE BESYLATE 2.5 MG PO TABS
2.5000 mg | ORAL_TABLET | Freq: Every day | ORAL | Status: DC
  Administered 2016-12-13: 2.5 mg via ORAL
  Filled 2016-12-12: qty 1

## 2016-12-12 MED ORDER — OMEPRAZOLE MAGNESIUM 20 MG PO TBEC: 20.0000 mg | DELAYED_RELEASE_TABLET | Freq: Every day | ORAL | Status: DC | PRN

## 2016-12-12 MED ORDER — EPHEDRINE SULFATE-NACL 50-0.9 MG/10ML-% IV SOSY
PREFILLED_SYRINGE | INTRAVENOUS | Status: DC | PRN
  Administered 2016-12-12: 10 mg via INTRAVENOUS

## 2016-12-12 MED ORDER — CEFAZOLIN SODIUM-DEXTROSE 2-4 GM/100ML-% IV SOLN
INTRAVENOUS | Status: AC
  Filled 2016-12-12: qty 100

## 2016-12-12 MED ORDER — ASPIRIN EC 81 MG PO TBEC
81.0000 mg | DELAYED_RELEASE_TABLET | Freq: Every day | ORAL | Status: DC
  Administered 2016-12-12 – 2016-12-13 (×2): 81 mg via ORAL
  Filled 2016-12-12 (×2): qty 1

## 2016-12-12 MED ORDER — SUGAMMADEX SODIUM 200 MG/2ML IV SOLN
INTRAVENOUS | Status: AC
  Filled 2016-12-12: qty 2

## 2016-12-12 MED ORDER — CEFAZOLIN SODIUM-DEXTROSE 2-4 GM/100ML-% IV SOLN
2.0000 g | INTRAVENOUS | Status: AC
  Administered 2016-12-12: 2 g via INTRAVENOUS

## 2016-12-12 MED ORDER — FENTANYL CITRATE (PF) 100 MCG/2ML IJ SOLN: 25.0000 ug | INTRAMUSCULAR | Status: DC | PRN

## 2016-12-12 MED ORDER — BISACODYL 5 MG PO TBEC: 5.0000 mg | DELAYED_RELEASE_TABLET | Freq: Every day | ORAL | Status: DC | PRN

## 2016-12-12 MED ORDER — CARVEDILOL 12.5 MG PO TABS
12.5000 mg | ORAL_TABLET | Freq: Two times a day (BID) | ORAL | Status: DC
  Administered 2016-12-12 – 2016-12-13 (×2): 12.5 mg via ORAL
  Filled 2016-12-12 (×2): qty 1

## 2016-12-12 MED ORDER — DEXAMETHASONE SODIUM PHOSPHATE 10 MG/ML IJ SOLN
INTRAMUSCULAR | Status: DC | PRN
  Administered 2016-12-12: 10 mg via INTRAVENOUS

## 2016-12-12 MED ORDER — MIDAZOLAM HCL 2 MG/2ML IJ SOLN: 1.0000 mg | INTRAMUSCULAR | Status: DC | PRN

## 2016-12-12 MED ORDER — BACITRACIN ZINC 500 UNIT/GM EX OINT
TOPICAL_OINTMENT | CUTANEOUS | Status: AC
  Filled 2016-12-12: qty 28.35

## 2016-12-12 MED ORDER — LEVETIRACETAM 500 MG PO TABS
500.0000 mg | ORAL_TABLET | Freq: Two times a day (BID) | ORAL | Status: DC
  Administered 2016-12-12 – 2016-12-13 (×2): 500 mg via ORAL
  Filled 2016-12-12 (×2): qty 1

## 2016-12-12 MED ORDER — SODIUM CHLORIDE 0.9 % IV SOLN
INTRAVENOUS | Status: DC | PRN
  Administered 2016-12-12: 15:00:00 via INTRAVENOUS

## 2016-12-12 MED ORDER — ONDANSETRON HCL 4 MG/2ML IJ SOLN: 4.0000 mg | INTRAMUSCULAR | Status: DC | PRN

## 2016-12-12 MED ORDER — MIDAZOLAM HCL 2 MG/2ML IJ SOLN
INTRAMUSCULAR | Status: AC
  Filled 2016-12-12: qty 2

## 2016-12-12 MED ORDER — VITAMIN-B COMPLEX PO TABS: 1.0000 | ORAL_TABLET | Freq: Every day | ORAL | Status: DC

## 2016-12-12 MED ORDER — CHLORHEXIDINE GLUCONATE CLOTH 2 % EX PADS: 6.0000 | MEDICATED_PAD | Freq: Once | CUTANEOUS | Status: DC

## 2016-12-12 MED ORDER — COENZYME Q10 100 MG PO CAPS: 200.0000 mg | ORAL_CAPSULE | Freq: Two times a day (BID) | ORAL | Status: DC

## 2016-12-12 MED ORDER — 0.9 % SODIUM CHLORIDE (POUR BTL) OPTIME
TOPICAL | Status: DC | PRN
  Administered 2016-12-12 (×4): 1000 mL

## 2016-12-12 MED ORDER — NORTRIPTYLINE HCL 25 MG PO CAPS
25.0000 mg | ORAL_CAPSULE | Freq: Every day | ORAL | Status: DC
  Administered 2016-12-12: 25 mg via ORAL
  Filled 2016-12-12 (×2): qty 1

## 2016-12-12 MED ORDER — PANTOPRAZOLE SODIUM 40 MG PO TBEC
40.0000 mg | DELAYED_RELEASE_TABLET | Freq: Every day | ORAL | Status: DC
  Administered 2016-12-13: 40 mg via ORAL
  Filled 2016-12-12: qty 1

## 2016-12-12 MED ORDER — PROMETHAZINE HCL 25 MG PO TABS: 12.5000 mg | ORAL_TABLET | ORAL | Status: DC | PRN

## 2016-12-12 MED ORDER — ADULT MULTIVITAMIN W/MINERALS CH
1.0000 | ORAL_TABLET | Freq: Every day | ORAL | Status: DC
  Administered 2016-12-13: 1 via ORAL
  Filled 2016-12-12: qty 1

## 2016-12-12 MED ORDER — ONDANSETRON HCL 4 MG PO TABS: 4.0000 mg | ORAL_TABLET | ORAL | Status: DC | PRN

## 2016-12-12 MED ORDER — LIDOCAINE-EPINEPHRINE (PF) 2 %-1:200000 IJ SOLN
INTRAMUSCULAR | Status: AC
  Filled 2016-12-12: qty 20

## 2016-12-12 MED ORDER — LIDOCAINE 2% (20 MG/ML) 5 ML SYRINGE
INTRAMUSCULAR | Status: AC
  Filled 2016-12-12: qty 10

## 2016-12-12 MED ORDER — HEMOSTATIC AGENTS (NO CHARGE) OPTIME
TOPICAL | Status: DC | PRN
  Administered 2016-12-12: 1 via TOPICAL

## 2016-12-12 MED ORDER — BACITRACIN ZINC 500 UNIT/GM EX OINT
TOPICAL_OINTMENT | CUTANEOUS | Status: DC | PRN
  Administered 2016-12-12 (×2): 1 via TOPICAL

## 2016-12-12 MED ORDER — SUCCINYLCHOLINE CHLORIDE 200 MG/10ML IV SOSY
PREFILLED_SYRINGE | INTRAVENOUS | Status: AC
Start: 2016-12-12 — End: 2016-12-12
  Filled 2016-12-12: qty 10

## 2016-12-12 MED ORDER — MUPIROCIN 2 % EX OINT
TOPICAL_OINTMENT | CUTANEOUS | Status: AC
  Filled 2016-12-12: qty 22

## 2016-12-12 MED ORDER — PHENYLEPHRINE HCL 10 MG/ML IJ SOLN
INTRAVENOUS | Status: DC | PRN
  Administered 2016-12-12: 20 ug/min via INTRAVENOUS

## 2016-12-12 MED ORDER — PHENYLEPHRINE 40 MCG/ML (10ML) SYRINGE FOR IV PUSH (FOR BLOOD PRESSURE SUPPORT)
PREFILLED_SYRINGE | INTRAVENOUS | Status: AC
  Filled 2016-12-12: qty 20

## 2016-12-12 SURGICAL SUPPLY — 91 items
BENZOIN TINCTURE PRP APPL 2/3 (GAUZE/BANDAGES/DRESSINGS) IMPLANT
BLADE CLIPPER SURG (BLADE) IMPLANT
BLADE SAW GIGLI 16 STRL (MISCELLANEOUS) IMPLANT
BLADE SURG 15 STRL LF DISP TIS (BLADE) IMPLANT
BLADE SURG 15 STRL SS (BLADE)
BLADE ULTRA TIP 2M (BLADE) IMPLANT
BNDG GAUZE ELAST 4 BULKY (GAUZE/BANDAGES/DRESSINGS) IMPLANT
BUR ACORN 6.0 PRECISION (BURR) ×3 IMPLANT
BUR ACORN 6.0MM PRECISION (BURR) ×1
BUR MATCHSTICK NEURO 3.0 LAGG (BURR) IMPLANT
BUR SPIRAL ROUTER 2.3 (BUR) ×3 IMPLANT
BUR SPIRAL ROUTER 2.3MM (BUR) ×1
CANISTER SUCT 3000ML PPV (MISCELLANEOUS) ×4 IMPLANT
CARTRIDGE OIL MAESTRO DRILL (MISCELLANEOUS) ×2 IMPLANT
CATH VENTRIC 35X38 W/TROCAR LG (CATHETERS) IMPLANT
CLIP TI MEDIUM 6 (CLIP) IMPLANT
CONT SPEC 4OZ CLIKSEAL STRL BL (MISCELLANEOUS) ×8 IMPLANT
COVER MAYO STAND STRL (DRAPES) IMPLANT
DECANTER SPIKE VIAL GLASS SM (MISCELLANEOUS) ×4 IMPLANT
DIFFUSER DRILL AIR PNEUMATIC (MISCELLANEOUS) ×4 IMPLANT
DRAIN SUBARACHNOID (WOUND CARE) IMPLANT
DRAPE CAMERA VIDEO/LASER (DRAPES) IMPLANT
DRAPE MICROSCOPE LEICA (MISCELLANEOUS) IMPLANT
DRAPE NEUROLOGICAL W/INCISE (DRAPES) IMPLANT
DRAPE ORTHO SPLIT 77X108 STRL (DRAPES) ×2
DRAPE STERI IOBAN 125X83 (DRAPES) IMPLANT
DRAPE SURG 17X23 STRL (DRAPES) ×4 IMPLANT
DRAPE SURG IRRIG POUCH 19X23 (DRAPES) IMPLANT
DRAPE SURG ORHT 6 SPLT 77X108 (DRAPES) ×2 IMPLANT
DRAPE WARM FLUID 44X44 (DRAPE) ×4 IMPLANT
DRSG TELFA 3X8 NADH (GAUZE/BANDAGES/DRESSINGS) ×4 IMPLANT
DURAPREP 6ML APPLICATOR 50/CS (WOUND CARE) IMPLANT
ELECT CAUTERY BLADE 6.4 (BLADE) IMPLANT
ELECT REM PT RETURN 9FT ADLT (ELECTROSURGICAL) ×4
ELECTRODE REM PT RTRN 9FT ADLT (ELECTROSURGICAL) ×2 IMPLANT
EVACUATOR 1/8 PVC DRAIN (DRAIN) IMPLANT
EVACUATOR SILICONE 100CC (DRAIN) IMPLANT
GAUZE SPONGE 4X4 12PLY STRL (GAUZE/BANDAGES/DRESSINGS) IMPLANT
GAUZE SPONGE 4X4 16PLY XRAY LF (GAUZE/BANDAGES/DRESSINGS) IMPLANT
GLOVE ECLIPSE 6.5 STRL STRAW (GLOVE) ×8 IMPLANT
GLOVE EXAM NITRILE LRG STRL (GLOVE) IMPLANT
GLOVE EXAM NITRILE XL STR (GLOVE) IMPLANT
GLOVE EXAM NITRILE XS STR PU (GLOVE) IMPLANT
GOWN STRL REUS W/ TWL LRG LVL3 (GOWN DISPOSABLE) ×2 IMPLANT
GOWN STRL REUS W/ TWL XL LVL3 (GOWN DISPOSABLE) ×4 IMPLANT
GOWN STRL REUS W/TWL 2XL LVL3 (GOWN DISPOSABLE) IMPLANT
GOWN STRL REUS W/TWL LRG LVL3 (GOWN DISPOSABLE) ×2
GOWN STRL REUS W/TWL XL LVL3 (GOWN DISPOSABLE) ×4
GRAFT DURAGEN MATRIX 1WX1L (Tissue) IMPLANT
GRAFT DURAGEN MATRIX 2WX2L ×4 IMPLANT
HEMOSTAT SURGICEL 2X14 (HEMOSTASIS) ×4 IMPLANT
KIT BASIN OR (CUSTOM PROCEDURE TRAY) ×8 IMPLANT
KIT DRAIN CSF ACCUDRAIN (MISCELLANEOUS) IMPLANT
KIT ROOM TURNOVER OR (KITS) ×4 IMPLANT
MARKER SPHERE PSV REFLC 13MM (MARKER) ×8 IMPLANT
NEEDLE HYPO 25X1 1.5 SAFETY (NEEDLE) ×4 IMPLANT
NEEDLE SPNL 18GX3.5 QUINCKE PK (NEEDLE) IMPLANT
NS IRRIG 1000ML POUR BTL (IV SOLUTION) ×16 IMPLANT
OIL CARTRIDGE MAESTRO DRILL (MISCELLANEOUS) ×4
PACK CRANIOTOMY (CUSTOM PROCEDURE TRAY) ×4 IMPLANT
PATTIES SURGICAL .25X.25 (GAUZE/BANDAGES/DRESSINGS) IMPLANT
PATTIES SURGICAL .5 X.5 (GAUZE/BANDAGES/DRESSINGS) IMPLANT
PATTIES SURGICAL .5 X3 (DISPOSABLE) IMPLANT
PATTIES SURGICAL 1/4 X 3 (GAUZE/BANDAGES/DRESSINGS) IMPLANT
PATTIES SURGICAL 1X1 (DISPOSABLE) IMPLANT
PLATE 1.5  2HOLE MED NEURO (Plate) ×2 IMPLANT
PLATE 1.5 2HOLE MED NEURO (Plate) ×2 IMPLANT
PLATE 1.5/0.5 13MM BURR HOLE (Plate) ×4 IMPLANT
RUBBERBAND STERILE (MISCELLANEOUS) IMPLANT
SCREW SELF DRILL HT 1.5/4MM (Screw) ×24 IMPLANT
SPECIMEN JAR SMALL (MISCELLANEOUS) IMPLANT
SPONGE NEURO XRAY DETECT 1X3 (DISPOSABLE) IMPLANT
SPONGE SURGIFOAM ABS GEL 100 (HEMOSTASIS) ×4 IMPLANT
STAPLER VISISTAT 35W (STAPLE) ×4 IMPLANT
SUT ETHILON 3 0 FSL (SUTURE) IMPLANT
SUT ETHILON 3 0 PS 1 (SUTURE) IMPLANT
SUT NURALON 4 0 TR CR/8 (SUTURE) ×8 IMPLANT
SUT SILK 0 TIES 10X30 (SUTURE) IMPLANT
SUT STEEL 0 (SUTURE)
SUT STEEL 0 18XMFL TIE 17 (SUTURE) IMPLANT
SUT VIC AB 2-0 CT2 18 VCP726D (SUTURE) ×8 IMPLANT
SYR CONTROL 10ML LL (SYRINGE) IMPLANT
TOWEL GREEN STERILE (TOWEL DISPOSABLE) ×4 IMPLANT
TOWEL GREEN STERILE FF (TOWEL DISPOSABLE) ×3 IMPLANT
TRAY FOLEY W/METER SILVER 16FR (SET/KITS/TRAYS/PACK) ×4 IMPLANT
TUBE CONNECTING 12'X1/4 (SUCTIONS)
TUBE CONNECTING 12X1/4 (SUCTIONS) IMPLANT
TUBE CONNECTING 20'X1/4 (TUBING) ×1
TUBE CONNECTING 20X1/4 (TUBING) ×3 IMPLANT
UNDERPAD 30X30 (UNDERPADS AND DIAPERS) ×4 IMPLANT
WATER STERILE IRR 1000ML POUR (IV SOLUTION) ×4 IMPLANT

## 2016-12-12 NOTE — Transfer of Care (Signed)
Immediate Anesthesia Transfer of Care Note  Patient: Savannah Beltran  Procedure(s) Performed: Procedure(s): OCCIPITAL CRANIOTOMY FOR BRAIN TUMOR (Left) APPLICATION OF CRANIAL NAVIGATION (Left)  Patient Location: PACU  Anesthesia Type:General  Level of Consciousness: awake, alert , oriented and patient cooperative  Airway & Oxygen Therapy: Patient Spontanous Breathing and Patient connected to nasal cannula oxygen  Post-op Assessment: Report given to RN, Post -op Vital signs reviewed and stable and Patient moving all extremities X 4  Post vital signs: Reviewed and stable  Last Vitals:  Vitals:   12/12/16 1150 12/12/16 1709  BP: (!) 223/112 (!) 159/81  Pulse:  62  Resp: (!) 23 16  Temp:  36.3 C    Last Pain:  Vitals:   12/12/16 1709  TempSrc:   PainSc: Asleep      Patients Stated Pain Goal: 7 (96/72/89 [id number])  Complications: No apparent anesthesia complications

## 2016-12-12 NOTE — Anesthesia Procedure Notes (Signed)
Procedure Name: Intubation Date/Time: 12/12/2016 3:05 PM Performed by: Julieta Bellini Pre-anesthesia Checklist: Patient identified, Emergency Drugs available, Suction available and Patient being monitored Patient Re-evaluated:Patient Re-evaluated prior to inductionOxygen Delivery Method: Circle system utilized Preoxygenation: Pre-oxygenation with 100% oxygen Intubation Type: IV induction Ventilation: Mask ventilation without difficulty and Oral airway inserted - appropriate to patient size Laryngoscope Size: Mac and 3 Grade View: Grade I Tube type: Oral Tube size: 7.0 mm Number of attempts: 1 Airway Equipment and Method: Stylet Placement Confirmation: ETT inserted through vocal cords under direct vision,  positive ETCO2 and breath sounds checked- equal and bilateral Secured at: 22 cm Tube secured with: Tape Dental Injury: Teeth and Oropharynx as per pre-operative assessment

## 2016-12-12 NOTE — Progress Notes (Signed)
Cuff BP 159-169/80, art lie 186/69,   SB/ NSR 56-60. Dr Remo Lipps will be by to see pt.  Dr Christella Noa also here, updated, OK with BP. No new orders. CXR done.

## 2016-12-12 NOTE — Op Note (Signed)
12/12/2016  5:38 PM  PATIENT:  Christiane Ha  83 y.o. female  PRE-OPERATIVE DIAGNOSIS:  BRAIN TUMOR, left occipital  POST-OPERATIVE DIAGNOSIS:  Intracerebral hemorrhage left occipital pole  PROCEDURE:  Procedure(s):Stereotactic OCCIPITAL CRANIOTOMY FOR BRAIN TUMOR APPLICATION OF CRANIAL NAVIGATION  SURGEON: Surgeon(s): Ashok Pall, MD Kary Kos, MD  ASSISTANTS:Cram, Dominica Severin  ANESTHESIA:   general  EBL:  Total I/O In: 1300 [I.V.:1300] Out: 47 [Urine:600; Blood:30]  BLOOD ADMINISTERED:none  CELL SAVER GIVEN:none  COUNT:per nursing  DRAINS: none   SPECIMEN:  Source of Specimen:  left occipital lobe  DICTATION: FAE BLOSSOM was taken to the operating room, intubated, and placed under a general anesthetic without difficulty. After adequate anesthesia was obtained I placed a three pin Mayfield head holder on her head. She was positioned prone on body rolls with all pressure points properly padded .Using the preoperative MRI which was loaded into the Brain Lab system I registered her head into the stereotactic system. Once complete I prepped her scalp with an 8 minute chlorhexidine shampoo of her head and hair.  I draped her head in a sterile manner. I reattached the brain lab array, and maintained registration. I planned the incision using the stereotactic system and the preop plan. I made a coronal incision overlying the lesion. I opened the incision with a 10 blade, and placed along with Dr. Saintclair Halsted raney clips on the scalp edges. I placed a small weitlaner retractor to expose the skull. I used a drill to create a burr hole laterally, then a craniotome to turn a craniotomy overlying the mass. I checked the position of the flap with the stereotactic system. We opened the dura in a cruciate fashion with a 15 blade and Metzenbaum scissors. I used the bipolar cautery to open the occipital lobe. After a small corticotomy we dissected bluntly and encountered old blood. We sent the contents  of the cavity to pathology. I did not appreciate a distinct mass in the hemorrhage cavity. We removed the blood, irrigated, and achieved hemostasis. I inspected the cavity walls and still did not appreciate a mass. I irrigated once more. I then closed the wound, with good hemostasis in the brain. I applied a dural substitute, then approximated the bone flap with plates and screws. I approximated the galea with vicryl sutures. I approximated the scalp edges with staples. I applied a sterile dressing. I removed the mayfield adapter from the head holder, we rolled her supine on to the stretcher and she was extubated.   PLAN OF CARE: Admit to inpatient   PATIENT DISPOSITION:  PACU - hemodynamically stable.   Delay start of Pharmacological VTE agent (>24hrs) due to surgical blood loss or risk of bleeding:  yes

## 2016-12-12 NOTE — Progress Notes (Signed)
eLink Physician-Brief Progress Note Patient Name: Savannah Beltran DOB: 1933-12-09 MRN: [id number]   Date of Service  12/12/2016  HPI/Events of Note  Lysbeth Galas check on patient admitted after occipital craniotomy. Extubated postoperatively. Admitted to neurosurgery service.   eICU Interventions  Continuing current plan of care per neurosurgery.      Intervention Category Evaluation Type: New Patient Evaluation  Tera Partridge 12/12/2016, 6:54 PM

## 2016-12-12 NOTE — Anesthesia Postprocedure Evaluation (Addendum)
Anesthesia Post Note  Patient: Savannah Beltran  Procedure(s) Performed: Procedure(s) (LRB): OCCIPITAL CRANIOTOMY FOR BRAIN TUMOR (Left) APPLICATION OF CRANIAL NAVIGATION (Left)  Patient location during evaluation: PACU Anesthesia Type: General Level of consciousness: awake and alert Pain management: pain level controlled Vital Signs Assessment: post-procedure vital signs reviewed and stable Respiratory status: spontaneous breathing, nonlabored ventilation, respiratory function stable and patient connected to nasal cannula oxygen Cardiovascular status: blood pressure returned to baseline and stable Postop Assessment: no signs of nausea or vomiting Anesthetic complications: no       Last Vitals:  Vitals:   12/12/16 1709 12/12/16 1720  BP: (!) 159/81   Pulse: 62 (!) 58  Resp: 16 13  Temp: 36.3 C     Last Pain:  Vitals:   12/12/16 1720  TempSrc:   PainSc: 0-No pain                 ROSE,GEORGE S

## 2016-12-12 NOTE — H&P (View-Only) (Signed)
Reason for Consult:brain tumor Referring Physician: ellah, otte is an 83 y.o. female.  HPI: whom was admitted yesterday due to finding on MRI brain of a hemorrhagic lesion in the left occipital pole. She has been having difficulty with her vision, and presented with malignant hypertension in the ED yesterday. Treated with cleviprex her blood pressure is now controlled. The tumor's origin is not clear given history of breast cancer v a new primary malignancy v primary brain tumor. Currently doing very well I have been asked to provide treatment for the mass. There is no shift, nor focal deficit other than right visual field.   Past Medical History:  Diagnosis Date  . Cancer [facility])    Breast Cancer  . Migraines     Past Surgical History:  Procedure Laterality Date  . BREAST SURGERY    . Partial hip replacement      History reviewed. No pertinent family history.  Social History:  reports that she has never smoked. She has never used smokeless tobacco. She reports that she does not drink alcohol or use drugs.  Allergies:  Allergies  Allergen Reactions  . Ace Inhibitors Cough    Per [facility]  . Amlodipine Other (See Comments)    Headache, per [facility]  . Angiotensin Receptor Blockers Cough    Per [facility]  . Percocet [Oxycodone-Acetaminophen] Other (See Comments)    "Worms crawling in" patient's head sensation    Medications: I have reviewed the patient's current medications.  Results for orders placed or performed during the hospital encounter of 12/05/16 (from the past 48 hour(s))  Protime-INR     Status: None   Collection Time: 12/05/16  5:43 PM  Result Value Ref Range   Prothrombin Time 13.0 11.4 - 15.2 seconds   INR 0.98   APTT     Status: None   Collection Time: 12/05/16  5:43 PM  Result Value Ref Range   aPTT 33 24 - 36 seconds  CBC     Status: Abnormal   Collection Time: 12/05/16  5:43 PM  Result Value Ref Range   WBC 7.5 4.0 - 10.5 K/uL   RBC 4.60 3.87 -  5.11 MIL/uL   Hemoglobin 11.7 (L) 12.0 - 15.0 g/dL   HCT 36.4 36.0 - 46.0 %   MCV 79.1 78.0 - 100.0 fL   MCH 25.4 (L) 26.0 - 34.0 pg   MCHC 32.1 30.0 - 36.0 g/dL   RDW 14.4 11.5 - 15.5 %   Platelets 270 150 - 400 K/uL  Differential     Status: None   Collection Time: 12/05/16  5:43 PM  Result Value Ref Range   Neutrophils Relative % 45 %   Neutro Abs 3.4 1.7 - 7.7 K/uL   Lymphocytes Relative 41 %   Lymphs Abs 3.1 0.7 - 4.0 K/uL   Monocytes Relative 10 %   Monocytes Absolute 0.8 0.1 - 1.0 K/uL   Eosinophils Relative 3 %   Eosinophils Absolute 0.3 0.0 - 0.7 K/uL   Basophils Relative 1 %   Basophils Absolute 0.1 0.0 - 0.1 K/uL  Comprehensive metabolic panel     Status: Abnormal   Collection Time: 12/05/16  5:43 PM  Result Value Ref Range   Sodium 139 135 - 145 mmol/L   Potassium 3.5 3.5 - 5.1 mmol/L   Chloride 106 101 - 111 mmol/L   CO2 27 22 - 32 mmol/L   Glucose, Bld 105 (H) 65 - 99 mg/dL   BUN 8  6 - 20 mg/dL   Creatinine, Ser 0.81 0.44 - 1.00 mg/dL   Calcium 9.5 8.9 - 10.3 mg/dL   Total Protein 6.8 6.5 - 8.1 g/dL   Albumin 3.9 3.5 - 5.0 g/dL   AST 24 15 - 41 U/L   ALT 15 14 - 54 U/L   Alkaline Phosphatase 45 38 - 126 U/L   Total Bilirubin 0.7 0.3 - 1.2 mg/dL   GFR calc non Af Amer >60 >60 mL/min   GFR calc Af Amer >60 >60 mL/min    Comment: (NOTE) The eGFR has been calculated using the CKD EPI equation. This calculation has not been validated in all clinical situations. eGFR's persistently <60 mL/min signify possible Chronic Kidney Disease.    Anion gap 6 5 - 15  Urine rapid drug screen (hosp performed)not at [facility]     Status: Abnormal   Collection Time: 12/05/16  5:43 PM  Result Value Ref Range   Opiates NONE DETECTED NONE DETECTED   Cocaine NONE DETECTED NONE DETECTED   Benzodiazepines NONE DETECTED NONE DETECTED   Amphetamines NONE DETECTED NONE DETECTED   Tetrahydrocannabinol NONE DETECTED NONE DETECTED   Barbiturates POSITIVE (A) NONE DETECTED    Comment:         DRUG SCREEN FOR MEDICAL PURPOSES ONLY.  IF CONFIRMATION IS NEEDED FOR ANY PURPOSE, NOTIFY LAB WITHIN 5 DAYS.        LOWEST DETECTABLE LIMITS FOR URINE DRUG SCREEN Drug Class       Cutoff (ng/mL) Amphetamine      1000 Barbiturate      200 Benzodiazepine   998 Tricyclics       338 Opiates          300 Cocaine          300 THC              50   Urinalysis, Routine w reflex microscopic     Status: Abnormal   Collection Time: 12/05/16  5:43 PM  Result Value Ref Range   Color, Urine YELLOW YELLOW   APPearance CLEAR CLEAR   Specific Gravity, Urine 1.004 (L) 1.005 - 1.030   pH 7.0 5.0 - 8.0   Glucose, UA NEGATIVE NEGATIVE mg/dL   Hgb urine dipstick NEGATIVE NEGATIVE   Bilirubin Urine NEGATIVE NEGATIVE   Ketones, ur NEGATIVE NEGATIVE mg/dL   Protein, ur NEGATIVE NEGATIVE mg/dL   Nitrite NEGATIVE NEGATIVE   Leukocytes, UA NEGATIVE NEGATIVE  I-stat troponin, ED (not at [facility])     Status: None   Collection Time: 12/05/16  5:54 PM  Result Value Ref Range   Troponin i, poc 0.00 0.00 - 0.08 ng/mL   Comment 3            Comment: Due to the release kinetics of cTnI, a negative result within the first hours of the onset of symptoms does not rule out myocardial infarction with certainty. If myocardial infarction is still suspected, repeat the test at appropriate intervals.   I-Stat Chem 8, ED  (not at [facility])     Status: Abnormal   Collection Time: 12/05/16  5:56 PM  Result Value Ref Range   Sodium 139 135 - 145 mmol/L   Potassium 3.4 (L) 3.5 - 5.1 mmol/L   Chloride 103 101 - 111 mmol/L   BUN 9 6 - 20 mg/dL   Creatinine, Ser 0.70 0.44 - 1.00 mg/dL   Glucose, Bld 102 (H) 65 - 99 mg/dL   Calcium, Ion 1.16  1.15 - 1.40 mmol/L   TCO2 26 0 - 100 mmol/L   Hemoglobin 12.9 12.0 - 15.0 g/dL   HCT 38.0 36.0 - 08.1 %  Basic metabolic panel     Status: Abnormal   Collection Time: 12/06/16  6:12 AM  Result Value Ref Range   Sodium 135 135 - 145 mmol/L   Potassium 3.2 (L) 3.5 -  5.1 mmol/L   Chloride 100 (L) 101 - 111 mmol/L   CO2 23 22 - 32 mmol/L   Glucose, Bld 116 (H) 65 - 99 mg/dL   BUN 8 6 - 20 mg/dL   Creatinine, Ser 0.77 0.44 - 1.00 mg/dL   Calcium 9.1 8.9 - 10.3 mg/dL   GFR calc non Af Amer >60 >60 mL/min   GFR calc Af Amer >60 >60 mL/min    Comment: (NOTE) The eGFR has been calculated using the CKD EPI equation. This calculation has not been validated in all clinical situations. eGFR's persistently <60 mL/min signify possible Chronic Kidney Disease.    Anion gap 12 5 - 15  CBC     Status: None   Collection Time: 12/06/16  6:12 AM  Result Value Ref Range   WBC 8.8 4.0 - 10.5 K/uL   RBC 4.83 3.87 - 5.11 MIL/uL   Hemoglobin 12.6 12.0 - 15.0 g/dL   HCT 38.1 36.0 - 46.0 %   MCV 78.9 78.0 - 100.0 fL   MCH 26.1 26.0 - 34.0 pg   MCHC 33.1 30.0 - 36.0 g/dL   RDW 14.5 11.5 - 15.5 %   Platelets 264 150 - 400 K/uL  Brain natriuretic peptide     Status: Abnormal   Collection Time: 12/06/16  6:13 AM  Result Value Ref Range   B Natriuretic Peptide 114.8 (H) 0.0 - 100.0 pg/mL  Surgical pcr screen     Status: Abnormal   Collection Time: 12/06/16  6:38 AM  Result Value Ref Range   MRSA, PCR NEGATIVE NEGATIVE   Staphylococcus aureus POSITIVE (A) NEGATIVE    Comment:        The Xpert SA Assay (FDA approved for NASAL specimens in patients over 19 years of age), is one component of a comprehensive surveillance program.  Test performance has been validated by [facility] for patients greater than or equal to 20 year old. It is not intended to diagnose infection nor to guide or monitor treatment.     Dg Chest Port 1 View  Result Date: 12/05/2016 CLINICAL DATA:  83 y/o  F; intracranial hemorrhage. EXAM: PORTABLE CHEST 1 VIEW COMPARISON:  None. FINDINGS: Normal cardiac silhouette given projection and technique. Aortic atherosclerosis with calcification. Clear lungs. No pleural effusion or pneumothorax. Degenerative changes of the glenohumeral joints. No  acute osseous abnormality is evident. IMPRESSION: No active disease.  Aortic atherosclerosis. Electronically Signed   By: Kristine Garbe M.D.   On: 12/05/2016 19:12    Review of Systems  Constitutional: Negative.   HENT: Negative.   Eyes: Positive for blurred vision.  Respiratory: Negative.   Cardiovascular: Negative.   Gastrointestinal: Negative.   Genitourinary: Negative.   Musculoskeletal: Negative.   Skin: Negative.   Neurological: Negative.   Endo/Heme/Allergies: Negative.   Psychiatric/Behavioral: Negative.    Blood pressure 138/71, pulse 66, temperature 97.8 F (36.6 C), temperature source Oral, resp. rate 18, height 5' 3"  (1.6 m), weight 64 kg (141 lb), SpO2 97 %. Physical Exam  Constitutional: She is oriented to person, place, and time. She appears well-developed and well-nourished.  No distress.  HENT:  Head: Normocephalic and atraumatic.  Right Ear: External ear normal.  Left Ear: External ear normal.  Nose: Nose normal.  Mouth/Throat: Oropharynx is clear and moist. No oropharyngeal exudate.  Eyes: Conjunctivae and EOM are normal. Pupils are equal, round, and reactive to light. Right eye exhibits no discharge. Left eye exhibits no discharge.  Decreased right visual field. Do not believe this is a complete hemianopsia, but close.   Neck: Normal range of motion. Neck supple.  Cardiovascular: Normal rate, regular rhythm, normal heart sounds and intact distal pulses.   Respiratory: Effort normal and breath sounds normal.  GI: Soft. Bowel sounds are normal.  Musculoskeletal: Normal range of motion.  Neurological: She is alert and oriented to person, place, and time. She has normal strength and normal reflexes. She displays normal reflexes. A cranial nerve deficit is present. No sensory deficit. She exhibits normal muscle tone. She displays a negative Romberg sign. Coordination normal. GCS eye subscore is 4. GCS verbal subscore is 5. GCS motor subscore is 6. She  displays no Babinski's sign on the right side. She displays no Babinski's sign on the left side.  Reflex Scores:      Tricep reflexes are 2+ on the right side and 2+ on the left side.      Bicep reflexes are 2+ on the right side and 2+ on the left side.      Brachioradialis reflexes are 2+ on the right side and 2+ on the left side.      Patellar reflexes are 2+ on the right side and 2+ on the left side.      Achilles reflexes are 2+ on the right side and 2+ on the left side. Skin: Skin is dry. She is not diaphoretic.  Psychiatric: Her behavior is normal. Judgment and thought content normal.    Assessment/Plan: Will discharge with keppra and decadron. Will schedule resection next week. I have shown the radiographic studies to the family.   Rob Mciver L 12/06/2016, 12:51 PM

## 2016-12-12 NOTE — Anesthesia Procedure Notes (Signed)
Central Venous Catheter Insertion Performed by: Effie Berkshire, anesthesiologist Start/End3/05/2017 12:50 PM, 12/12/2016 1:00 PM Patient location: Pre-op. Preanesthetic checklist: patient identified, IV checked, site marked, risks and benefits discussed, surgical consent, monitors and equipment checked, pre-op evaluation, timeout performed and anesthesia consent Position: Trendelenburg Lidocaine 1% used for infiltration and patient sedated Hand hygiene performed , maximum sterile barriers used  and Seldinger technique used Catheter size: 8 Fr Total catheter length 16. Central line was placed.Double lumen Procedure performed using ultrasound guided technique. Ultrasound Notes:anatomy identified, needle tip was noted to be adjacent to the nerve/plexus identified, no ultrasound evidence of intravascular and/or intraneural injection and image(s) printed for medical record Attempts: 1 Following insertion, dressing applied, line sutured and Biopatch. Post procedure assessment: blood return through all ports  Patient tolerated the procedure well with no immediate complications.

## 2016-12-12 NOTE — Interval H&P Note (Signed)
History and Physical Interval Note:  12/12/2016 2:31 PM  Savannah Beltran  has presented today for surgery, with the diagnosis of BRAIN TUMOR  The various methods of treatment have been discussed with the patient and family. After consideration of risks, benefits and other options for treatment, the patient has consented to  Procedure(s) with comments: OCCIPITAL CRANIOTOMY FOR BRAIN TUMOR (Left) - [place] (Left) as a surgical intervention .  The patient's history has been reviewed, patient examined, no change in status, stable for surgery.  I have reviewed the patient's chart and labs.  Questions were answered to the patient's satisfaction.     [name]

## 2016-12-12 NOTE — Anesthesia Preprocedure Evaluation (Addendum)
Anesthesia Evaluation  Patient identified by MRN, date of birth, ID band Patient awake    Reviewed: Allergy & Precautions, NPO status , Patient's Chart, lab work & pertinent test results, reviewed documented beta blocker date and time   History of Anesthesia Complications (+) PONV and history of anesthetic complications  Airway Mallampati: I  TM Distance: >3 FB Neck ROM: Full    Dental  (+) Upper Dentures, Lower Dentures   Pulmonary neg pulmonary ROS,    breath sounds clear to auscultation       Cardiovascular hypertension, Pt. on medications and Pt. on home beta blockers + Valvular Problems/Murmurs  Rhythm:Regular Rate:Normal     Neuro/Psych  Headaches, negative psych ROS   GI/Hepatic Neg liver ROS, GERD  Medicated,  Endo/Other  negative endocrine ROS  Renal/GU negative Renal ROS  negative genitourinary   Musculoskeletal negative musculoskeletal ROS (+)   Abdominal   Peds negative pediatric ROS (+)  Hematology negative hematology ROS (+)   Anesthesia Other Findings   Reproductive/Obstetrics negative OB ROS                           Anesthesia Physical Anesthesia Plan  ASA: III  Anesthesia Plan: General   Post-op Pain Management:    Induction: Intravenous  Airway Management Planned: Oral ETT  Additional Equipment: Arterial line and CVP  Intra-op Plan:   Post-operative Plan: Possible Post-op intubation/ventilation  Informed Consent: I have reviewed the patients History and Physical, chart, labs and discussed the procedure including the risks, benefits and alternatives for the proposed anesthesia with the patient or authorized representative who has indicated his/her understanding and acceptance.   Dental advisory given  Plan Discussed with: CRNA and Anesthesiologist  Anesthesia Plan Comments: (- Elevated BP in Preop, will treat with Hydralazine and re-assess.)       Anesthesia Quick Evaluation

## 2016-12-12 NOTE — Progress Notes (Signed)
Pt BP elevated 200/90s pt asymptomatic. HR 49-52. Dr. Smith Robert notified. New order to give hydralazine 5mg  and re-check in 30 min.

## 2016-12-13 ENCOUNTER — Encounter (HOSPITAL_COMMUNITY): Payer: Self-pay | Admitting: Neurosurgery

## 2016-12-13 MED ORDER — TRAMADOL HCL 50 MG PO TABS: 50.0000 mg | ORAL_TABLET | Freq: Four times a day (QID) | ORAL | 0 refills | Status: DC | PRN

## 2016-12-13 MED ORDER — DEXAMETHASONE 4 MG PO TABS: 4.0000 mg | ORAL_TABLET | Freq: Two times a day (BID) | ORAL | 0 refills | Status: AC

## 2016-12-13 MED ORDER — LEVETIRACETAM 500 MG PO TABS: 500.0000 mg | ORAL_TABLET | Freq: Two times a day (BID) | ORAL | 1 refills | Status: AC

## 2016-12-13 NOTE — Discharge Summary (Signed)
Physician Discharge Summary  Patient ID: Savannah Beltran MRN: [id number] DOB/AGE: 1934/07/02 83 y.o.  Admit date: 12/12/2016 Discharge date: 12/13/2016  Admission Diagnoses:brain tumor, left occipital  Discharge Diagnoses:  Active Problems:   Intracerebral hemorrhage [facility])   Discharged Condition: good  Hospital Course: Savannah Beltran was admitted yesterday for tumor resection. She underwent a stereotactic craniotomy and I evacuated a blood clot. It was not apparent that anything else was present in the resection cavity besides old blood. Post op she is voiding, ambulating, and tolerating a regular diet without difficulty. Her wound is clean, dry, and without signs of infection. She is alert,oriented x 4, and moving all extremities   Treatments: surgery: Craniotomy for hematoma vacuation  Discharge Exam: Blood pressure 130/81, pulse 67, temperature 98.2 F (36.8 C), temperature source Oral, resp. rate (!) 22, height 5\' 3"  (1.6 m), weight 66.5 kg (146 lb 9.7 oz), SpO2 94 %. General appearance: alert, cooperative, appears stated age and no distress  Disposition: 01-Home or Self Care BRAIN TUMOR  Allergies as of 12/13/2016      Reactions   Ace Inhibitors Cough   Angiotensin Receptor Blockers Cough   Percocet [oxycodone-acetaminophen] Other (See Comments)   "Worms crawling in" patient's head sensation   Amlodipine Other (See Comments)   Headache      Medication List    TAKE these medications   acetaminophen 500 MG tablet Commonly known as:  TYLENOL Take 1,000 mg by mouth every 6 (six) hours as needed (for headaches or pain).   amLODipine 2.5 MG tablet Commonly known as:  NORVASC Take 1 tablet (2.5 mg total) by mouth daily.   aspirin EC 81 MG tablet Take 81 mg by mouth daily.   carvedilol 12.5 MG tablet Commonly known as:  COREG Take 12.5 mg by mouth 2 (two) times daily with a meal.   COCONUT OIL PO Take 15 mLs by mouth See admin instructions. Mixed with a drink once a  day   Coenzyme Q10 100 MG capsule Take 200 mg by mouth 2 (two) times daily.   dexamethasone 4 MG tablet Commonly known as:  DECADRON Take 1 tablet (4 mg total) by mouth 2 (two) times daily. What changed:  when to take this   Garlic Oil 5361 MG Caps Take 1,000 mg by mouth daily.   levETIRAcetam 500 MG tablet Commonly known as:  KEPPRA Take 1 tablet (500 mg total) by mouth every 12 (twelve) hours.   medium chain triglycerides oil Commonly known as:  MCT OIL Take 15 mLs by mouth every other day.   NEXIUM 24HR 20 MG Tbec Generic drug:  Esomeprazole Magnesium Take 1 tablet by mouth daily as needed (for reflux/indigestion).   nortriptyline 25 MG capsule Commonly known as:  PAMELOR Take 25 mg by mouth at bedtime.   omeprazole 20 MG tablet Commonly known as:  PRILOSEC OTC Take 20 mg by mouth daily as needed (for reflux/indigestion).   ONE-A-DAY WOMENS 50+ ADVANTAGE Tabs Take 1 tablet by mouth daily with breakfast.   pravastatin 10 MG tablet Commonly known as:  PRAVACHOL Take 10 mg by mouth at bedtime.   riboflavin 100 MG Tabs tablet Commonly known as:  VITAMIN B-2 Take 100 mg by mouth daily.   traMADol 50 MG tablet Commonly known as:  ULTRAM Take 1 tablet (50 mg total) by mouth every 6 (six) hours as needed.   VITAMIN D-3 PO Take 1 capsule by mouth daily.   Vitamin-B Complex Tabs Take 1 tablet by mouth daily.  Follow-up Information    Miken Stecher L, MD Follow up in 1 week(s).   Specialty:  Neurosurgery Why:  please call the office to make an appointment to have staples removed in one week. Contact information: [address] [place] [address] [phone]           Signed: Winfield Beltran 12/13/2016, 3:49 PM

## 2016-12-13 NOTE — Discharge Instructions (Signed)
Craniotomy °Care After °Please read the instructions outlined below and refer to this sheet in the next few weeks. These discharge instructions provide you with general information on caring for yourself after you leave the hospital. Your surgeon may also give you specific instructions. While your treatment has been planned according to the most current medical practices available, unavoidable complications occasionally occur. If you have any problems or questions after discharge, please call your surgeon. °Although there are many types of brain surgery, recovery following craniotomy (surgical opening of the skull) is much the same for each. However, recovery depends on many factors. These include the type and severity of brain injury and the type of surgery. It also depends on any nervous system function problems (neurological deficits) before surgery. If the craniotomy was done for cancer, chemotherapy and radiation could follow. You could be in the hospital from 5 days to a couple weeks. This depends on the type of surgery, findings, and whether there are complications. °HOME CARE INSTRUCTIONS  °· It is not unusual to hear a clicking noise after a craniotomy, the plates and screws used to attach the bone flap can sometimes cause this. It is a normal occurrence if this does happen °· Do not drive for 10 days after the operation °· Your scalp may feel spongy for a while, because of fluid under it. This will gradually get better. Occasionally, the surgeon will not replace the bone that was removed to access the brain. If there is a bony defect, the surgeon will ask you to wear a helmet for protection. This is a discussion you should have with your surgeon prior to leaving the hospital (discharge). °· Numbness may persist in some areas of your scalp. °· Take all medications as directed. Sometimes steroids to control swelling are prescribed. Anticonvulsants to prevent seizures may also be given. Do not use alcohol,  other drugs, or medications unless your surgeon says it is OK. °· Keep the wound dry and clean. The wound may be washed gently with soap and water. Then, you may gently blot or dab it dry, without rubbing. Do not take baths, use swimming pools or hot tubs for 10 days, or as instructed by your caregiver. It is best to wait to see you surgeon at your first postoperative visit, and to get directions at that time. °· Only take over-the-counter or prescription medicines for pain, discomfort, or fever as directed by your caregiver. °· You may continue your normal diet, as directed. °· Walking is OK for exercise. Wait at least 3 months before you return to mild, non-contact sports or as your surgeon suggests. Contact sports should be avoided for at least 1 year, unless your surgeon says it is OK. °· If you are prescribed steroids, take them exactly as prescribed. If you start having a decrease in nervous system functions (neurological deficits) and headaches as the dose of steroids is reduced, tell your surgeon right away. °· When the anticonvulsant prescription is finished you no longer need to take it. °SEEK IMMEDIATE MEDICAL CARE IF:  °· You develop nausea, vomiting, severe headaches, confusion, or you have a seizure. °· You develop chest pain, a stiff neck, or difficulty breathing. °· There is redness, swelling, or increasing pain in the wound or pin insertion sites. °· You have an increase in swelling or bruising around the eyes. °· There is drainage or pus coming from the wound. °· You have an oral temperature above 102° F (38.9° C), not controlled by medicine. °·   You notice a foul smell coming from the wound or dressing. °· The wound breaks open (edges not staying together) after the stitches have been removed. °· You develop dizziness or fainting while standing. °· You develop a rash. °· You develop any reaction or side effects to the medications given. °Document Released: 12/24/2005 Document Revised: 12/16/2011  Document Reviewed: 10/02/2009 °Exit[employer]® Patient Information ©2013 [employer]. ° °

## 2016-12-13 NOTE — Care Management Note (Addendum)
Case Management Note  Patient Details  Name: Savannah Beltran MRN: [id number] Date of Birth: 09/26/1934  Subjective/Objective:  Pt admitted on 12/12/16 s/p occipital craniotomy for brain tumor.  PTA, pt independent, lives with spouse.                    Action/Plan: Discharge today; no dc needs identified.  Expected Discharge Date:  12/13/16               Expected Discharge Plan:  Home/Self Care  In-House Referral:     Discharge planning Services  CM Consult  Post Acute Care Choice:    Choice offered to:     DME Arranged:    DME Agency:     HH Arranged:    HH Agency:     Status of Service:  Completed, will sign off If discussed at Long Length of Stay Meetings, dates discussed:    Additional Comments:  Ella Bodo, RN 12/13/2016, 4:54 PM

## 2016-12-13 NOTE — Progress Notes (Signed)
Discharge packet and instructions given to patient and family. Understanding verbalized.  IV removed. Patient transported in wheelchair to car by nurse tech.

## 2016-12-13 NOTE — Progress Notes (Signed)
Patient ID: Christiane Ha, female   DOB: Dec 10, 1933, 83 y.o.   MRN: [id number] BP 130/81   Pulse 67   Temp 98.2 F (36.8 C) (Oral)   Resp (!) 22   Ht 5\' 3"  (1.6 m)   Wt 66.5 kg (146 lb 9.7 oz)   SpO2 94%   BMI 25.97 kg/m  Alert and oriented x 4, speech is clear and fluent Perrl, full eom Moving all extremities well Discharge today

## 2016-12-18 DIAGNOSIS — I10 Essential (primary) hypertension: Secondary | ICD-10-CM | POA: Diagnosis not present

## 2016-12-18 DIAGNOSIS — I619 Nontraumatic intracerebral hemorrhage, unspecified: Secondary | ICD-10-CM | POA: Diagnosis not present

## 2016-12-18 DIAGNOSIS — H547 Unspecified visual loss: Secondary | ICD-10-CM | POA: Diagnosis not present

## 2016-12-18 DIAGNOSIS — R51 Headache: Secondary | ICD-10-CM | POA: Diagnosis not present

## 2017-01-01 DIAGNOSIS — I10 Essential (primary) hypertension: Secondary | ICD-10-CM | POA: Diagnosis not present

## 2017-01-01 DIAGNOSIS — Z1389 Encounter for screening for other disorder: Secondary | ICD-10-CM | POA: Diagnosis not present

## 2017-01-01 DIAGNOSIS — H539 Unspecified visual disturbance: Secondary | ICD-10-CM | POA: Diagnosis not present

## 2017-01-15 DIAGNOSIS — D0512 Intraductal carcinoma in situ of left breast: Secondary | ICD-10-CM | POA: Diagnosis not present

## 2017-01-15 DIAGNOSIS — R928 Other abnormal and inconclusive findings on diagnostic imaging of breast: Secondary | ICD-10-CM | POA: Diagnosis not present

## 2017-01-23 DIAGNOSIS — H43393 Other vitreous opacities, bilateral: Secondary | ICD-10-CM | POA: Diagnosis not present

## 2017-01-29 DIAGNOSIS — I10 Essential (primary) hypertension: Secondary | ICD-10-CM | POA: Diagnosis not present

## 2017-02-04 DIAGNOSIS — H5340 Unspecified visual field defects: Secondary | ICD-10-CM | POA: Diagnosis not present

## 2017-02-11 DIAGNOSIS — D0512 Intraductal carcinoma in situ of left breast: Secondary | ICD-10-CM | POA: Diagnosis not present

## 2017-02-13 DIAGNOSIS — E785 Hyperlipidemia, unspecified: Secondary | ICD-10-CM | POA: Diagnosis not present

## 2017-02-13 DIAGNOSIS — Z1389 Encounter for screening for other disorder: Secondary | ICD-10-CM | POA: Diagnosis not present

## 2017-02-13 DIAGNOSIS — Z9181 History of falling: Secondary | ICD-10-CM | POA: Diagnosis not present

## 2017-02-13 DIAGNOSIS — Z136 Encounter for screening for cardiovascular disorders: Secondary | ICD-10-CM | POA: Diagnosis not present

## 2017-02-13 DIAGNOSIS — Z0000 Encounter for general adult medical examination without abnormal findings: Secondary | ICD-10-CM | POA: Diagnosis not present

## 2017-03-05 IMAGING — DX DG CHEST 1V PORT
1 series · 1 of 1 positions shown · non-contrast
Comparison: None.

CLINICAL DATA: 82 y/o  F; intracranial hemorrhage.

EXAM:
PORTABLE CHEST 1 VIEW

[chest ap]
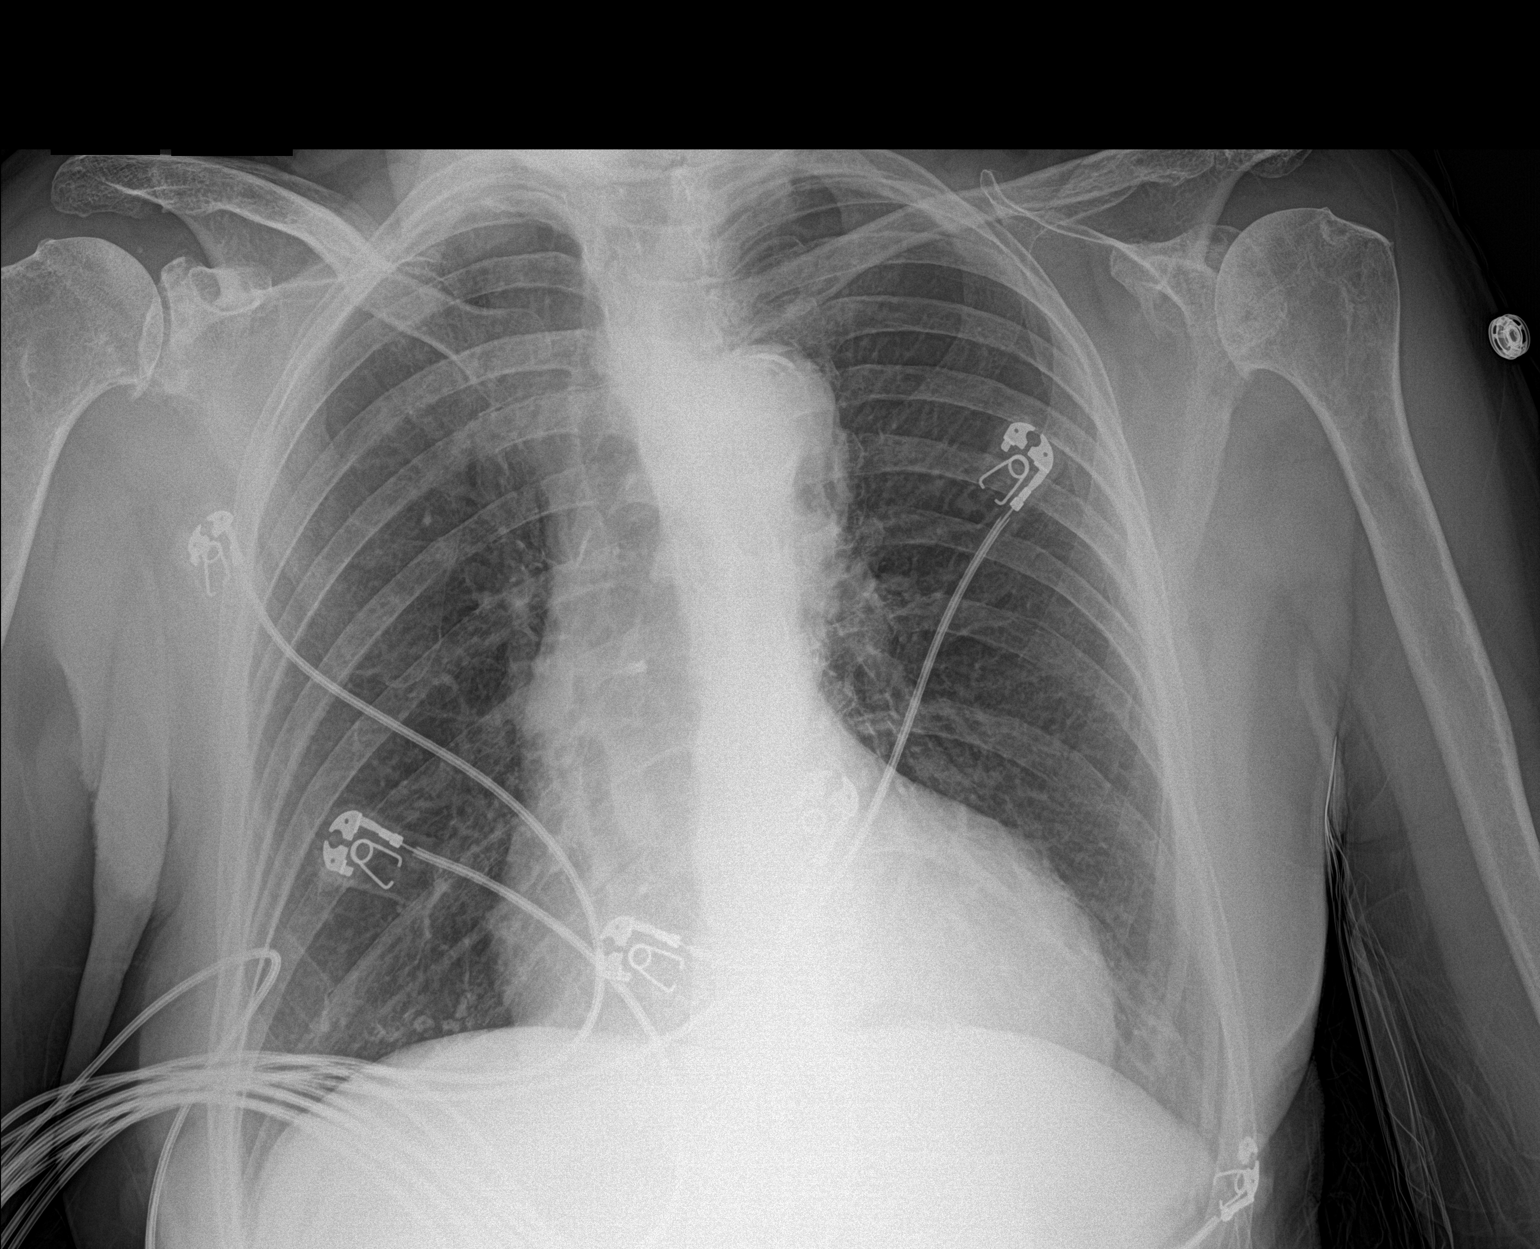

[1 of 1 positions shown; findings below may reference images not displayed]

FINDINGS: Normal cardiac silhouette given projection and technique. Aortic
atherosclerosis with calcification. Clear lungs. No pleural effusion
or pneumothorax. Degenerative changes of the glenohumeral joints. No
acute osseous abnormality is evident.
IMPRESSION: No active disease.  Aortic atherosclerosis.

By: Scepan-Scepo Ilibasic M.D.

## 2017-03-07 NOTE — Addendum Note (Signed)
Addendum  created 03/07/17 1104 by Effie Berkshire, MD   Sign clinical note

## 2017-03-11 IMAGING — MR MR HEAD WO/W CM
8 of 11 series · 25 of 48 positions shown · IV contrast (multihance)
Comparison: 12/10/2016 CT of the head. MRI of the brain dated
12/06/2014.

CLINICAL DATA: 82 y/o F; history of breast cancer with brain tumor.
SRS protocol.

EXAM:
MRI HEAD WITHOUT AND WITH CONTRAST
TECHNIQUE: Multiplanar, multiecho pulse sequences of the brain and surrounding
structures were obtained without and with intravenous contrast.
CONTRAST:  13mL MULTIHANCE GADOBENATE DIMEGLUMINE 529 MG/ML IV SOLN

[Series 3: T1 · sagittal · 3.0mm · 0.47mm/px · 2 of 37 slices shown]
[im 1/37]
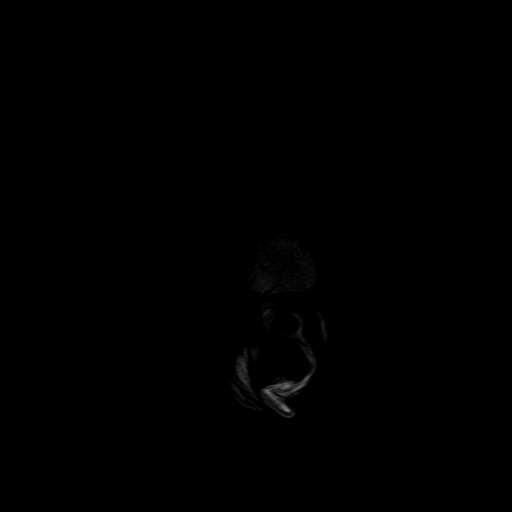
[im 19/37]
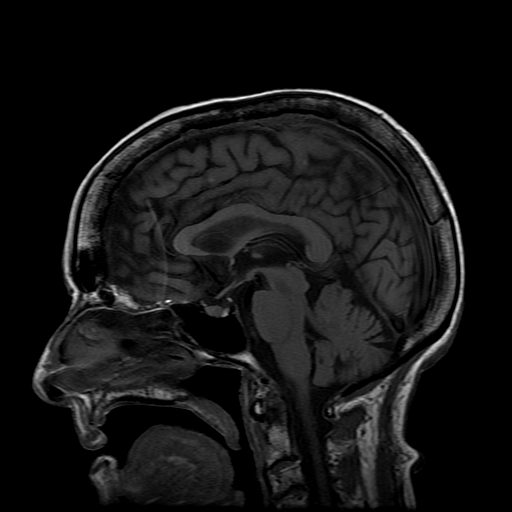

[Series 4: DWI · axial · 3.0mm · 1.09mm/px · z∈[-60,+96]mm · 7 of 106 slices shown (1 of 2)]
[im 1/106]
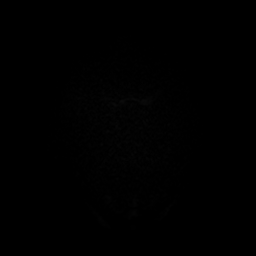
[im 18/106]
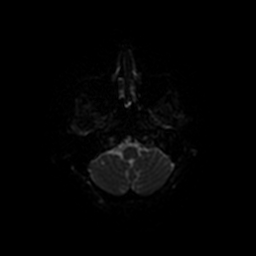
[im 36/106]
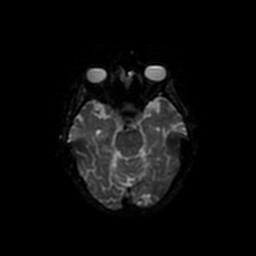
[im 53/106]
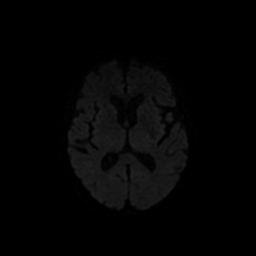
[im 71/106]
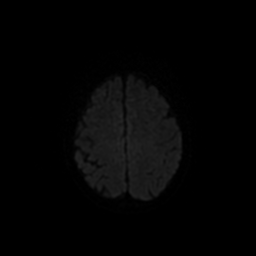
[im 88/106]
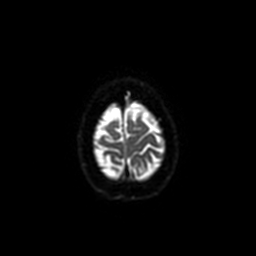
[im 106/106]
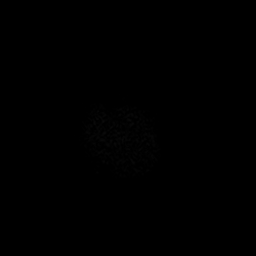

[Series 5: T2 · axial · 5.0mm · 0.43mm/px · z∈[-61,+94]mm · 2 of 27 slices shown]
[im 1/27]
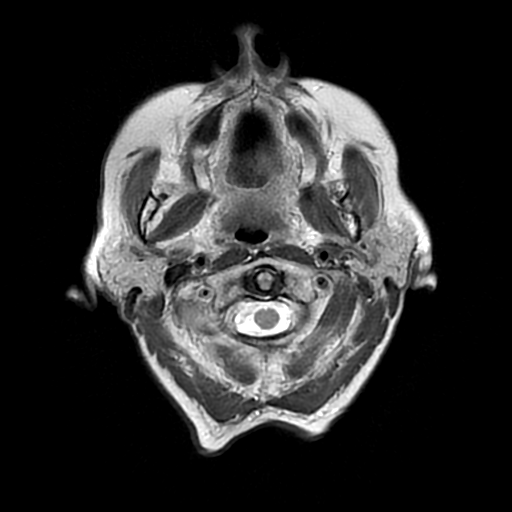
[im 27/27]
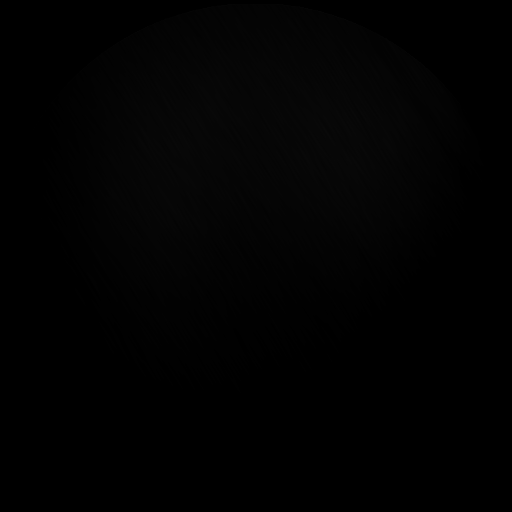

[Series 7: FLAIR · axial · 3.0mm · 0.43mm/px · z∈[-70,+89]mm · 3 of 54 slices shown]
[im 1/54]
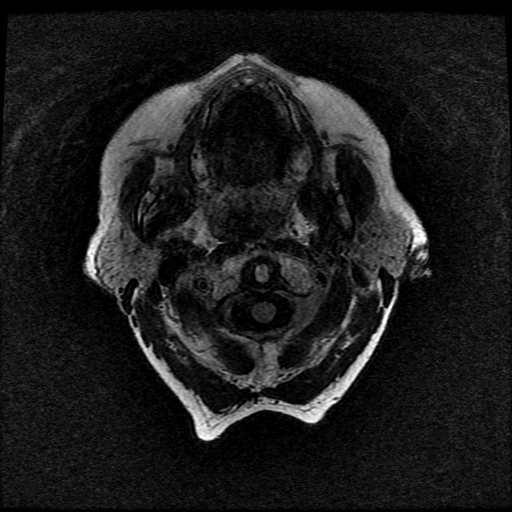
[im 27/54]
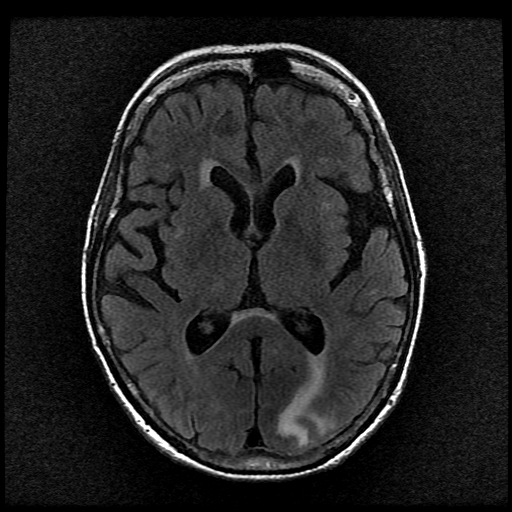
[im 54/54]
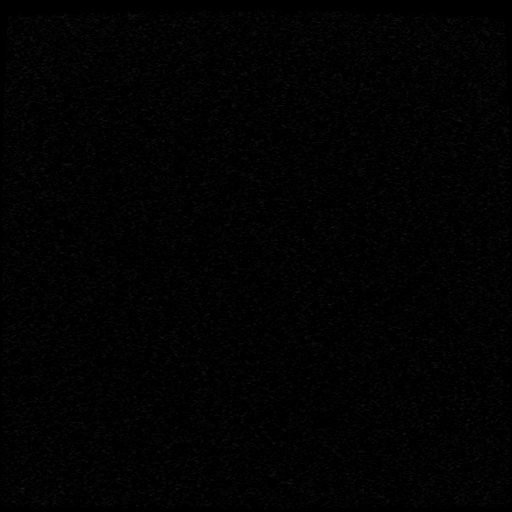

[Series 9: T2 post-contrast · coronal · 3.0mm · 0.47mm/px · 3 of 47 slices shown]
[im 1/47]
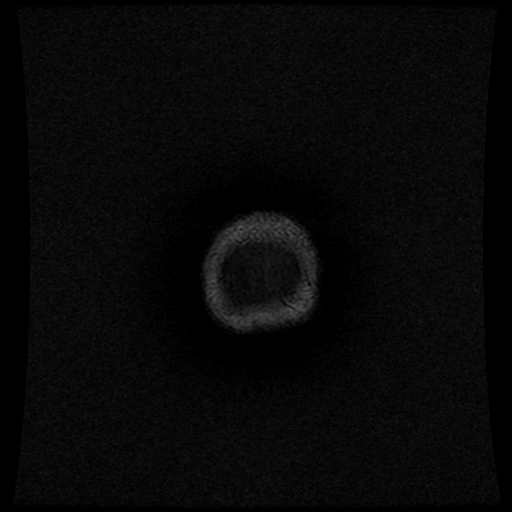
[im 24/47]
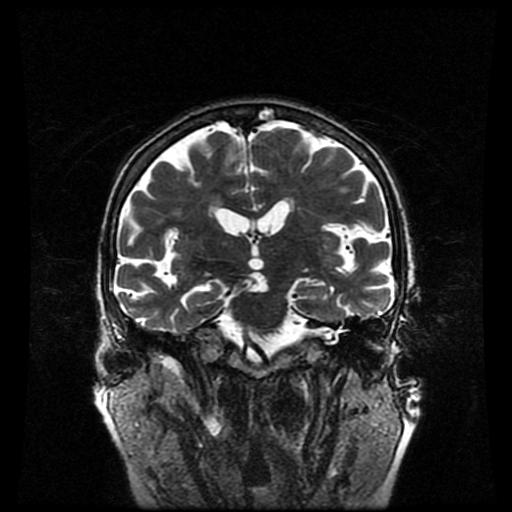
[im 47/47]
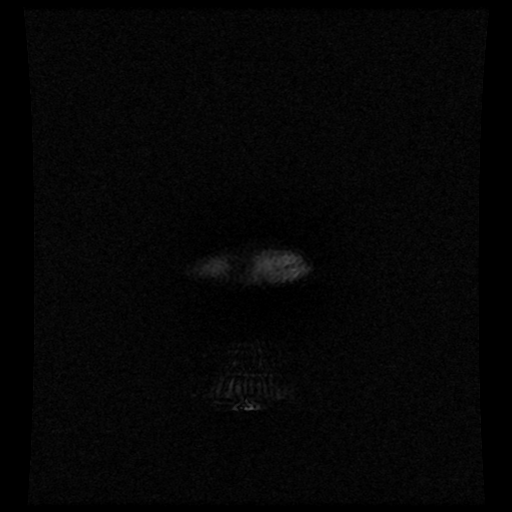

[Series 11: T1 post-contrast · coronal · 3.0mm · 0.47mm/px · 3 of 47 slices shown (1 of 2)]
[im 1/47]
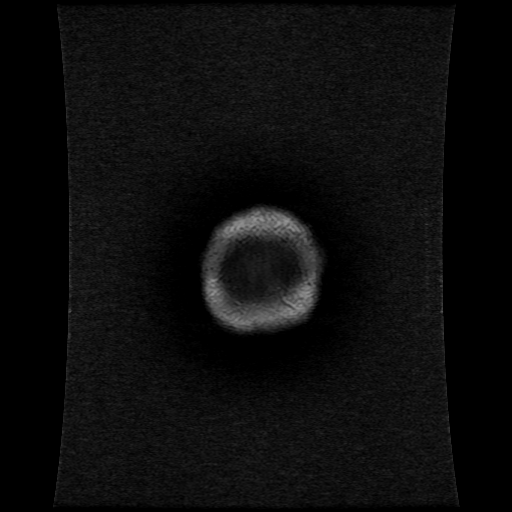
[im 24/47]
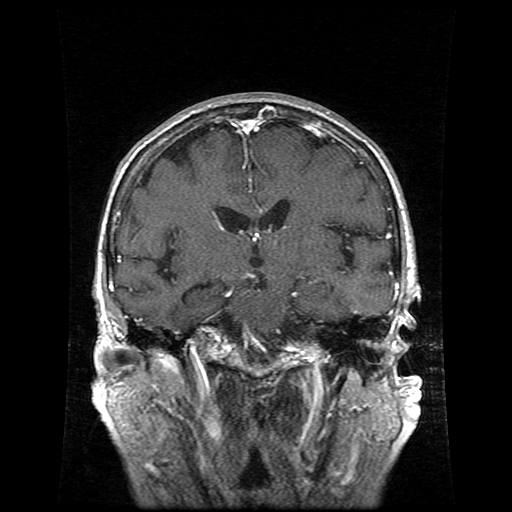
[im 47/47]
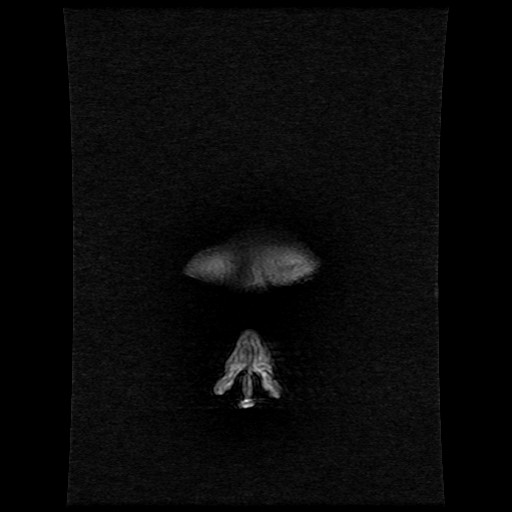

[Series 12: T1 post-contrast · sagittal · 3.0mm · 0.47mm/px · 2 of 37 slices shown (2 of 2)]
[im 1/37]
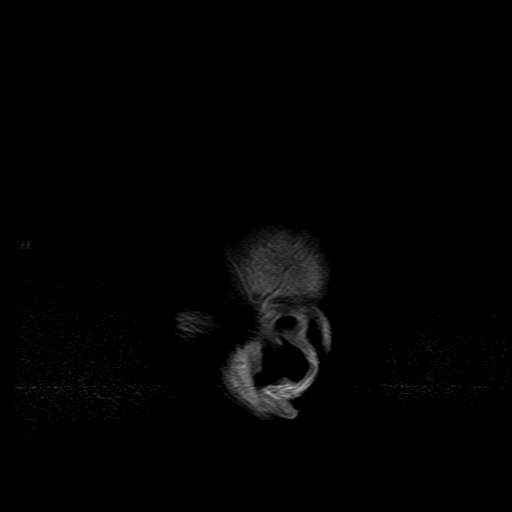
[im 37/37]
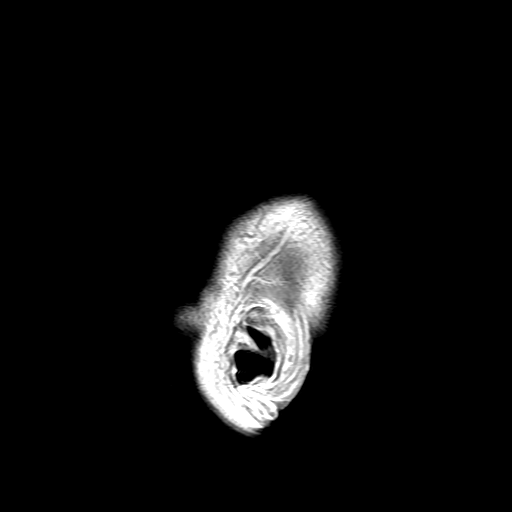

[Series 400: DWI · axial · 3.0mm · 1.09mm/px · z∈[-60,+96]mm · 3 of 53 slices shown (2 of 2)]
[im 1/53]
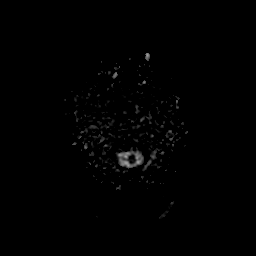
[im 27/53]
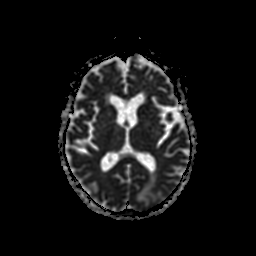
[im 53/53]
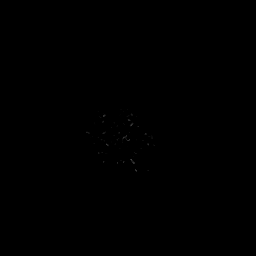

[25 of 48 positions shown; findings below may reference images not displayed]

FINDINGS: Brain: 5 mm focus of diffusion restriction within the right lateral
parietal cortex (series 4, image 28). Left occipital lesion with
intrinsic T1/T2 hyperintense signal and areas of enhancement
measuring 24 x 24 x 22 mm (AP x ML x CC series 10, image 56 and
series 12, image 24). Surrounding the mass is T2 FLAIR hyperintense
signal in white matter compatible with vasogenic edema with mild
local mass effect. There are areas of susceptibility hypointensity
compatible with hemosiderin deposition at the peripheries of the
lesion.

No additional abnormal enhancement of the brain is identified.

There is a background of mild to moderate chronic microvascular
ischemic changes of white matter, parenchymal volume loss, and a
small chronic cortical infarct in the left parietal lobe. No
hydrocephalus, extra-axial collection, or effacement of basilar
cisterns. No significant mass effect. Punctate focus of
susceptibility within the right posterior temporal lobe and right
lateral frontal lobe without signal abnormality on other sequences
likely represents hemosiderin deposition from old microhemorrhage.

Vascular: Normal flow voids.

Skull and upper cervical spine: Small lucent foci within the
calvarium on prior CT demonstrate T1 hyperintensity in probably
represent focal fat or hemangioma. No definite calvarial metastasis
is identified.

Sinuses/Orbits: Mild anterior ethmoid sinus mucosal thickening and
maxillary sinus mucosal thickening. Left mastoid effusion. Orbits
are unremarkable.

Other: None.
IMPRESSION: 1. Stable left occipital hemorrhagic lesion, favor hemorrhagic
metastasis over subacute hematoma given history of primary
malignancy.
2. New punctate focus of diffusion restriction in the right lateral
parietal cortex without enhancement may represent acute ischemia or
metastasis. Attention at follow-up is recommended.
3. Moderate chronic microvascular ischemic changes and moderate
parenchymal volume loss of the brain.
4. Mild ethmoid sinus disease and left mastoid effusion.
5. Lucent foci within calvarium on prior CT demonstrate T1
hyperintensity probably representing focal fat or hemangioma.
These results will be called to the ordering clinician or
representative by the Radiologist Assistant, and communication
documented in the PACS or zVision Dashboard.

By: Yaniris Sasaki M.D.

## 2017-04-02 DIAGNOSIS — Z6825 Body mass index (BMI) 25.0-25.9, adult: Secondary | ICD-10-CM | POA: Diagnosis not present

## 2017-04-02 DIAGNOSIS — R51 Headache: Secondary | ICD-10-CM | POA: Diagnosis not present

## 2017-04-02 DIAGNOSIS — D539 Nutritional anemia, unspecified: Secondary | ICD-10-CM | POA: Diagnosis not present

## 2017-04-02 DIAGNOSIS — I10 Essential (primary) hypertension: Secondary | ICD-10-CM | POA: Diagnosis not present

## 2017-04-02 DIAGNOSIS — E785 Hyperlipidemia, unspecified: Secondary | ICD-10-CM | POA: Diagnosis not present

## 2017-07-02 DIAGNOSIS — I10 Essential (primary) hypertension: Secondary | ICD-10-CM | POA: Diagnosis not present

## 2017-07-02 DIAGNOSIS — I619 Nontraumatic intracerebral hemorrhage, unspecified: Secondary | ICD-10-CM | POA: Diagnosis not present

## 2017-07-02 DIAGNOSIS — R600 Localized edema: Secondary | ICD-10-CM | POA: Diagnosis not present

## 2017-07-02 DIAGNOSIS — D539 Nutritional anemia, unspecified: Secondary | ICD-10-CM | POA: Diagnosis not present

## 2017-07-02 DIAGNOSIS — E785 Hyperlipidemia, unspecified: Secondary | ICD-10-CM | POA: Diagnosis not present

## 2017-07-02 DIAGNOSIS — Z6825 Body mass index (BMI) 25.0-25.9, adult: Secondary | ICD-10-CM | POA: Diagnosis not present

## 2017-09-03 DIAGNOSIS — H5340 Unspecified visual field defects: Secondary | ICD-10-CM | POA: Diagnosis not present

## 2017-11-05 DIAGNOSIS — I10 Essential (primary) hypertension: Secondary | ICD-10-CM | POA: Diagnosis not present

## 2017-11-05 DIAGNOSIS — Z2821 Immunization not carried out because of patient refusal: Secondary | ICD-10-CM | POA: Diagnosis not present

## 2017-11-05 DIAGNOSIS — D539 Nutritional anemia, unspecified: Secondary | ICD-10-CM | POA: Diagnosis not present

## 2017-11-05 DIAGNOSIS — E785 Hyperlipidemia, unspecified: Secondary | ICD-10-CM | POA: Diagnosis not present

## 2017-11-05 DIAGNOSIS — Z6825 Body mass index (BMI) 25.0-25.9, adult: Secondary | ICD-10-CM | POA: Diagnosis not present

## 2017-12-18 DIAGNOSIS — R1013 Epigastric pain: Secondary | ICD-10-CM | POA: Diagnosis not present

## 2017-12-18 DIAGNOSIS — K921 Melena: Secondary | ICD-10-CM | POA: Diagnosis not present

## 2017-12-18 DIAGNOSIS — I10 Essential (primary) hypertension: Secondary | ICD-10-CM | POA: Diagnosis not present

## 2017-12-18 DIAGNOSIS — D539 Nutritional anemia, unspecified: Secondary | ICD-10-CM | POA: Diagnosis not present

## 2018-01-06 DIAGNOSIS — Z7982 Long term (current) use of aspirin: Secondary | ICD-10-CM | POA: Diagnosis not present

## 2018-01-06 DIAGNOSIS — Z87891 Personal history of nicotine dependence: Secondary | ICD-10-CM | POA: Diagnosis not present

## 2018-01-06 DIAGNOSIS — I10 Essential (primary) hypertension: Secondary | ICD-10-CM | POA: Diagnosis not present

## 2018-01-06 DIAGNOSIS — R1011 Right upper quadrant pain: Secondary | ICD-10-CM | POA: Diagnosis not present

## 2018-01-06 DIAGNOSIS — Z79899 Other long term (current) drug therapy: Secondary | ICD-10-CM | POA: Diagnosis not present

## 2018-01-15 DIAGNOSIS — C44319 Basal cell carcinoma of skin of other parts of face: Secondary | ICD-10-CM | POA: Diagnosis not present

## 2018-01-15 DIAGNOSIS — L578 Other skin changes due to chronic exposure to nonionizing radiation: Secondary | ICD-10-CM | POA: Diagnosis not present

## 2018-01-15 DIAGNOSIS — L814 Other melanin hyperpigmentation: Secondary | ICD-10-CM | POA: Diagnosis not present

## 2018-01-21 DIAGNOSIS — D0512 Intraductal carcinoma in situ of left breast: Secondary | ICD-10-CM | POA: Diagnosis not present

## 2018-01-21 DIAGNOSIS — R928 Other abnormal and inconclusive findings on diagnostic imaging of breast: Secondary | ICD-10-CM | POA: Diagnosis not present

## 2018-01-22 DIAGNOSIS — D500 Iron deficiency anemia secondary to blood loss (chronic): Secondary | ICD-10-CM | POA: Diagnosis not present

## 2018-01-22 DIAGNOSIS — Z791 Long term (current) use of non-steroidal anti-inflammatories (NSAID): Secondary | ICD-10-CM | POA: Diagnosis not present

## 2018-01-26 DIAGNOSIS — Z853 Personal history of malignant neoplasm of breast: Secondary | ICD-10-CM | POA: Diagnosis not present

## 2018-01-26 DIAGNOSIS — D0512 Intraductal carcinoma in situ of left breast: Secondary | ICD-10-CM | POA: Diagnosis not present

## 2018-01-28 DIAGNOSIS — D500 Iron deficiency anemia secondary to blood loss (chronic): Secondary | ICD-10-CM | POA: Diagnosis not present

## 2018-01-28 DIAGNOSIS — Z791 Long term (current) use of non-steroidal anti-inflammatories (NSAID): Secondary | ICD-10-CM | POA: Diagnosis not present

## 2018-01-28 DIAGNOSIS — K5730 Diverticulosis of large intestine without perforation or abscess without bleeding: Secondary | ICD-10-CM | POA: Diagnosis not present

## 2018-01-28 DIAGNOSIS — D509 Iron deficiency anemia, unspecified: Secondary | ICD-10-CM | POA: Diagnosis not present

## 2018-01-28 DIAGNOSIS — K449 Diaphragmatic hernia without obstruction or gangrene: Secondary | ICD-10-CM | POA: Diagnosis not present

## 2018-01-28 DIAGNOSIS — K2970 Gastritis, unspecified, without bleeding: Secondary | ICD-10-CM | POA: Diagnosis not present

## 2018-02-26 DIAGNOSIS — H6122 Impacted cerumen, left ear: Secondary | ICD-10-CM | POA: Diagnosis not present

## 2018-02-26 DIAGNOSIS — H9190 Unspecified hearing loss, unspecified ear: Secondary | ICD-10-CM | POA: Diagnosis not present

## 2018-02-26 DIAGNOSIS — J342 Deviated nasal septum: Secondary | ICD-10-CM | POA: Diagnosis not present

## 2018-03-06 DIAGNOSIS — Z1389 Encounter for screening for other disorder: Secondary | ICD-10-CM | POA: Diagnosis not present

## 2018-03-06 DIAGNOSIS — Z1331 Encounter for screening for depression: Secondary | ICD-10-CM | POA: Diagnosis not present

## 2018-03-06 DIAGNOSIS — I10 Essential (primary) hypertension: Secondary | ICD-10-CM | POA: Diagnosis not present

## 2018-03-06 DIAGNOSIS — D539 Nutritional anemia, unspecified: Secondary | ICD-10-CM | POA: Diagnosis not present

## 2018-03-06 DIAGNOSIS — Z9181 History of falling: Secondary | ICD-10-CM | POA: Diagnosis not present

## 2018-03-06 DIAGNOSIS — E785 Hyperlipidemia, unspecified: Secondary | ICD-10-CM | POA: Diagnosis not present

## 2018-03-06 DIAGNOSIS — Z139 Encounter for screening, unspecified: Secondary | ICD-10-CM | POA: Diagnosis not present

## 2018-05-27 DIAGNOSIS — D500 Iron deficiency anemia secondary to blood loss (chronic): Secondary | ICD-10-CM | POA: Diagnosis not present

## 2018-05-28 DIAGNOSIS — D500 Iron deficiency anemia secondary to blood loss (chronic): Secondary | ICD-10-CM | POA: Diagnosis not present

## 2018-05-28 DIAGNOSIS — Z1212 Encounter for screening for malignant neoplasm of rectum: Secondary | ICD-10-CM | POA: Diagnosis not present

## 2018-09-10 DIAGNOSIS — D500 Iron deficiency anemia secondary to blood loss (chronic): Secondary | ICD-10-CM | POA: Diagnosis not present

## 2018-10-01 DIAGNOSIS — D500 Iron deficiency anemia secondary to blood loss (chronic): Secondary | ICD-10-CM | POA: Diagnosis not present

## 2018-10-15 DIAGNOSIS — D500 Iron deficiency anemia secondary to blood loss (chronic): Secondary | ICD-10-CM | POA: Diagnosis not present

## 2018-11-09 DIAGNOSIS — R51 Headache: Secondary | ICD-10-CM | POA: Diagnosis not present

## 2018-11-09 DIAGNOSIS — Z6825 Body mass index (BMI) 25.0-25.9, adult: Secondary | ICD-10-CM | POA: Diagnosis not present

## 2018-11-09 DIAGNOSIS — I10 Essential (primary) hypertension: Secondary | ICD-10-CM | POA: Diagnosis not present

## 2018-11-09 DIAGNOSIS — M5412 Radiculopathy, cervical region: Secondary | ICD-10-CM | POA: Diagnosis not present

## 2018-11-09 DIAGNOSIS — E785 Hyperlipidemia, unspecified: Secondary | ICD-10-CM | POA: Diagnosis not present

## 2018-11-09 DIAGNOSIS — D539 Nutritional anemia, unspecified: Secondary | ICD-10-CM | POA: Diagnosis not present

## 2018-11-16 ENCOUNTER — Inpatient Hospital Stay (HOSPITAL_COMMUNITY)
Admission: EM | Admit: 2018-11-16 | Discharge: 2018-12-06 | DRG: 064 | Disposition: E | Payer: Medicare HMO | Attending: Neurology | Admitting: Neurology

## 2018-11-16 ENCOUNTER — Emergency Department (HOSPITAL_COMMUNITY): Payer: Medicare HMO

## 2018-11-16 ENCOUNTER — Inpatient Hospital Stay (HOSPITAL_COMMUNITY): Payer: Medicare HMO

## 2018-11-16 DIAGNOSIS — I491 Atrial premature depolarization: Secondary | ICD-10-CM | POA: Diagnosis not present

## 2018-11-16 DIAGNOSIS — R531 Weakness: Secondary | ICD-10-CM | POA: Diagnosis present

## 2018-11-16 DIAGNOSIS — Z833 Family history of diabetes mellitus: Secondary | ICD-10-CM

## 2018-11-16 DIAGNOSIS — Z96649 Presence of unspecified artificial hip joint: Secondary | ICD-10-CM | POA: Diagnosis present

## 2018-11-16 DIAGNOSIS — I161 Hypertensive emergency: Secondary | ICD-10-CM | POA: Diagnosis not present

## 2018-11-16 DIAGNOSIS — I611 Nontraumatic intracerebral hemorrhage in hemisphere, cortical: Principal | ICD-10-CM | POA: Diagnosis present

## 2018-11-16 DIAGNOSIS — J9690 Respiratory failure, unspecified, unspecified whether with hypoxia or hypercapnia: Secondary | ICD-10-CM | POA: Diagnosis not present

## 2018-11-16 DIAGNOSIS — R4020 Unspecified coma: Secondary | ICD-10-CM | POA: Diagnosis not present

## 2018-11-16 DIAGNOSIS — Z79891 Long term (current) use of opiate analgesic: Secondary | ICD-10-CM

## 2018-11-16 DIAGNOSIS — G935 Compression of brain: Secondary | ICD-10-CM | POA: Diagnosis not present

## 2018-11-16 DIAGNOSIS — I472 Ventricular tachycardia: Secondary | ICD-10-CM | POA: Diagnosis present

## 2018-11-16 DIAGNOSIS — I612 Nontraumatic intracerebral hemorrhage in hemisphere, unspecified: Secondary | ICD-10-CM | POA: Diagnosis not present

## 2018-11-16 DIAGNOSIS — I10 Essential (primary) hypertension: Secondary | ICD-10-CM | POA: Diagnosis not present

## 2018-11-16 DIAGNOSIS — I616 Nontraumatic intracerebral hemorrhage, multiple localized: Secondary | ICD-10-CM

## 2018-11-16 DIAGNOSIS — Z4682 Encounter for fitting and adjustment of non-vascular catheter: Secondary | ICD-10-CM | POA: Diagnosis not present

## 2018-11-16 DIAGNOSIS — Z888 Allergy status to other drugs, medicaments and biological substances status: Secondary | ICD-10-CM

## 2018-11-16 DIAGNOSIS — R0689 Other abnormalities of breathing: Secondary | ICD-10-CM | POA: Diagnosis not present

## 2018-11-16 DIAGNOSIS — Z885 Allergy status to narcotic agent status: Secondary | ICD-10-CM

## 2018-11-16 DIAGNOSIS — I619 Nontraumatic intracerebral hemorrhage, unspecified: Secondary | ICD-10-CM | POA: Diagnosis present

## 2018-11-16 DIAGNOSIS — J9600 Acute respiratory failure, unspecified whether with hypoxia or hypercapnia: Secondary | ICD-10-CM | POA: Diagnosis not present

## 2018-11-16 DIAGNOSIS — R29728 NIHSS score 28: Secondary | ICD-10-CM | POA: Diagnosis not present

## 2018-11-16 DIAGNOSIS — R404 Transient alteration of awareness: Secondary | ICD-10-CM | POA: Diagnosis not present

## 2018-11-16 DIAGNOSIS — Z66 Do not resuscitate: Secondary | ICD-10-CM | POA: Diagnosis present

## 2018-11-16 DIAGNOSIS — Z978 Presence of other specified devices: Secondary | ICD-10-CM | POA: Diagnosis not present

## 2018-11-16 DIAGNOSIS — Z8249 Family history of ischemic heart disease and other diseases of the circulatory system: Secondary | ICD-10-CM

## 2018-11-16 DIAGNOSIS — R471 Dysarthria and anarthria: Secondary | ICD-10-CM | POA: Diagnosis present

## 2018-11-16 DIAGNOSIS — R4781 Slurred speech: Secondary | ICD-10-CM | POA: Diagnosis not present

## 2018-11-16 DIAGNOSIS — I615 Nontraumatic intracerebral hemorrhage, intraventricular: Secondary | ICD-10-CM | POA: Diagnosis not present

## 2018-11-16 DIAGNOSIS — Z853 Personal history of malignant neoplasm of breast: Secondary | ICD-10-CM

## 2018-11-16 DIAGNOSIS — Z79899 Other long term (current) drug therapy: Secondary | ICD-10-CM | POA: Diagnosis not present

## 2018-11-16 DIAGNOSIS — I629 Nontraumatic intracranial hemorrhage, unspecified: Secondary | ICD-10-CM | POA: Diagnosis not present

## 2018-11-16 DIAGNOSIS — G936 Cerebral edema: Secondary | ICD-10-CM

## 2018-11-16 DIAGNOSIS — Z7982 Long term (current) use of aspirin: Secondary | ICD-10-CM | POA: Diagnosis not present

## 2018-11-16 DIAGNOSIS — I610 Nontraumatic intracerebral hemorrhage in hemisphere, subcortical: Secondary | ICD-10-CM | POA: Diagnosis not present

## 2018-11-16 LAB — POCT I-STAT 7, (LYTES, BLD GAS, ICA,H+H)
Acid-base deficit: 1 mmol/L (ref 0.0–2.0)
Bicarbonate: 24.1 mmol/L (ref 20.0–28.0)
Calcium, Ion: 1.21 mmol/L (ref 1.15–1.40)
HCT: 41 % (ref 36.0–46.0)
HEMOGLOBIN: 13.9 g/dL (ref 12.0–15.0)
O2 Saturation: 97 %
POTASSIUM: 2.9 mmol/L — AB (ref 3.5–5.1)
Sodium: 133 mmol/L — ABNORMAL LOW (ref 135–145)
TCO2: 25 mmol/L (ref 22–32)
pCO2 arterial: 39.9 mmHg (ref 32.0–48.0)
pH, Arterial: 7.388 (ref 7.350–7.450)
pO2, Arterial: 88 mmHg (ref 83.0–108.0)

## 2018-11-16 LAB — COMPREHENSIVE METABOLIC PANEL
ALT: 17 U/L (ref 0–44)
AST: 33 U/L (ref 15–41)
Albumin: 3.9 g/dL (ref 3.5–5.0)
Alkaline Phosphatase: 40 U/L (ref 38–126)
Anion gap: 11 (ref 5–15)
BUN: 10 mg/dL (ref 8–23)
CO2: 20 mmol/L — ABNORMAL LOW (ref 22–32)
Calcium: 9.2 mg/dL (ref 8.9–10.3)
Chloride: 102 mmol/L (ref 98–111)
Creatinine, Ser: 0.78 mg/dL (ref 0.44–1.00)
GFR calc Af Amer: >60 mL/min (ref >60)
GFR calc non Af Amer: >60 mL/min (ref >60)
Glucose, Bld: 120 mg/dL — ABNORMAL HIGH (ref 70–99)
Potassium: 3.9 mmol/L (ref 3.5–5.1)
Sodium: 133 mmol/L — ABNORMAL LOW (ref 135–145)
Total Bilirubin: 0.8 mg/dL (ref 0.3–1.2)
Total Protein: 6.8 g/dL (ref 6.5–8.1)

## 2018-11-16 LAB — GLUCOSE, CAPILLARY
Glucose-Capillary: 117 mg/dL — ABNORMAL HIGH (ref 70–99)
Glucose-Capillary: 138 mg/dL — ABNORMAL HIGH (ref 70–99)

## 2018-11-16 LAB — CBC
HCT: 41.8 % (ref 36.0–46.0)
Hemoglobin: 13.5 g/dL (ref 12.0–15.0)
MCH: 27.2 pg (ref 26.0–34.0)
MCHC: 32.3 g/dL (ref 30.0–36.0)
MCV: 84.1 fL (ref 80.0–100.0)
NRBC: 0 % (ref 0.0–0.2)
Platelets: 219 10*3/uL (ref 150–400)
RBC: 4.97 MIL/uL (ref 3.87–5.11)
RDW: 13.5 % (ref 11.5–15.5)
WBC: 9.3 10*3/uL (ref 4.0–10.5)

## 2018-11-16 LAB — DIFFERENTIAL
Abs Immature Granulocytes: 0.02 10*3/uL (ref 0.00–0.07)
Basophils Absolute: 0.1 10*3/uL (ref 0.0–0.1)
Basophils Relative: 1 %
Eosinophils Absolute: 0.2 10*3/uL (ref 0.0–0.5)
Eosinophils Relative: 3 %
IMMATURE GRANULOCYTES: 0 %
Lymphocytes Relative: 37 %
Lymphs Abs: 3.4 10*3/uL (ref 0.7–4.0)
Monocytes Absolute: 0.9 10*3/uL (ref 0.1–1.0)
Monocytes Relative: 10 %
Neutro Abs: 4.7 10*3/uL (ref 1.7–7.7)
Neutrophils Relative %: 49 %

## 2018-11-16 LAB — SODIUM: SODIUM: 135 mmol/L (ref 135–145)

## 2018-11-16 LAB — PROTIME-INR
INR: 1.02
Prothrombin Time: 13.3 seconds (ref 11.4–15.2)

## 2018-11-16 LAB — APTT: aPTT: 30 seconds (ref 24–36)

## 2018-11-16 LAB — MRSA PCR SCREENING: MRSA by PCR: NEGATIVE

## 2018-11-16 MED ORDER — FENTANYL 2500MCG IN NS 250ML (10MCG/ML) PREMIX INFUSION
0.0000 ug/h | INTRAVENOUS | Status: DC
  Administered 2018-11-16: 100 ug/h via INTRAVENOUS
  Filled 2018-11-16: qty 250

## 2018-11-16 MED ORDER — FENTANYL CITRATE (PF) 100 MCG/2ML IJ SOLN
INTRAMUSCULAR | Status: AC | PRN
  Administered 2018-11-16: 100 ug via INTRAVENOUS

## 2018-11-16 MED ORDER — SENNOSIDES-DOCUSATE SODIUM 8.6-50 MG PO TABS
1.0000 | ORAL_TABLET | Freq: Two times a day (BID) | ORAL | Status: DC
  Administered 2018-11-18: 1 via ORAL
  Filled 2018-11-16 (×2): qty 1

## 2018-11-16 MED ORDER — STROKE: EARLY STAGES OF RECOVERY BOOK: Freq: Once | Status: DC

## 2018-11-16 MED ORDER — INSULIN ASPART 100 UNIT/ML ~~LOC~~ SOLN
0.0000 [IU] | SUBCUTANEOUS | Status: DC
  Administered 2018-11-17 – 2018-11-18 (×3): 1 [IU] via SUBCUTANEOUS

## 2018-11-16 MED ORDER — SODIUM CHLORIDE 0.9% FLUSH
3.0000 mL | Freq: Once | INTRAVENOUS | Status: AC
  Administered 2018-11-16: 3 mL via INTRAVENOUS

## 2018-11-16 MED ORDER — SODIUM CHLORIDE 3 % IV SOLN
INTRAVENOUS | Status: DC
  Administered 2018-11-16 – 2018-11-17 (×3): 50 mL/h via INTRAVENOUS
  Filled 2018-11-16 (×3): qty 500

## 2018-11-16 MED ORDER — FENTANYL CITRATE (PF) 100 MCG/2ML IJ SOLN: 100.0000 ug | Freq: Once | INTRAMUSCULAR | Status: DC

## 2018-11-16 MED ORDER — ETOMIDATE 2 MG/ML IV SOLN
INTRAVENOUS | Status: AC | PRN
  Administered 2018-11-16: 20 mg via INTRAVENOUS

## 2018-11-16 MED ORDER — FENTANYL CITRATE (PF) 100 MCG/2ML IJ SOLN
INTRAMUSCULAR | Status: AC
  Filled 2018-11-16: qty 2

## 2018-11-16 MED ORDER — SODIUM CHLORIDE 0.9 % IV SOLN: INTRAVENOUS | Status: DC

## 2018-11-16 MED ORDER — CLEVIDIPINE BUTYRATE 0.5 MG/ML IV EMUL
0.0000 mg/h | INTRAVENOUS | Status: DC
  Administered 2018-11-16: 6 mg/h via INTRAVENOUS
  Administered 2018-11-17: 2 mg/h via INTRAVENOUS
  Administered 2018-11-17 (×2): 6 mg/h via INTRAVENOUS
  Administered 2018-11-18 (×2): 8 mg/h via INTRAVENOUS
  Administered 2018-11-18: 2 mg/h via INTRAVENOUS
  Filled 2018-11-16 (×11): qty 50

## 2018-11-16 MED ORDER — CHLORHEXIDINE GLUCONATE 0.12% ORAL RINSE (MEDLINE KIT)
15.0000 mL | Freq: Two times a day (BID) | OROMUCOSAL | Status: DC
  Administered 2018-11-16 – 2018-11-18 (×3): 15 mL via OROMUCOSAL

## 2018-11-16 MED ORDER — ORAL CARE MOUTH RINSE
15.0000 mL | OROMUCOSAL | Status: DC
  Administered 2018-11-16 – 2018-11-18 (×13): 15 mL via OROMUCOSAL

## 2018-11-16 MED ORDER — POTASSIUM CHLORIDE 20 MEQ/15ML (10%) PO SOLN
40.0000 meq | ORAL | Status: AC
  Administered 2018-11-16 – 2018-11-17 (×3): 40 meq via ORAL
  Filled 2018-11-16 (×3): qty 30

## 2018-11-16 MED ORDER — SUCCINYLCHOLINE CHLORIDE 20 MG/ML IJ SOLN
INTRAMUSCULAR | Status: AC | PRN
  Administered 2018-11-16: 100 mg via INTRAVENOUS

## 2018-11-16 MED ORDER — CLEVIDIPINE BUTYRATE 0.5 MG/ML IV EMUL
INTRAVENOUS | Status: AC
  Administered 2018-11-16: 25 mg
  Filled 2018-11-16: qty 50

## 2018-11-16 MED ORDER — INSULIN ASPART 100 UNIT/ML ~~LOC~~ SOLN: 0.0000 [IU] | Freq: Three times a day (TID) | SUBCUTANEOUS | Status: DC

## 2018-11-16 MED ORDER — PANTOPRAZOLE SODIUM 40 MG IV SOLR
40.0000 mg | Freq: Every day | INTRAVENOUS | Status: DC
  Administered 2018-11-16: 40 mg via INTRAVENOUS
  Filled 2018-11-16: qty 40

## 2018-11-16 NOTE — Progress Notes (Signed)
Patient was intubated by ED MD with positive color change noted.  Once xray was obtained ETT was pulled back to 21 at the lip.  Patient then transported to CT and back without complications.  Will continue to monitor.

## 2018-11-16 NOTE — Progress Notes (Signed)
Patient transported to [facility] ICU from ED on vent. No noted respiratory issues at this time.

## 2018-11-16 NOTE — ED Triage Notes (Addendum)
Pt was speaking to her grandson at 71 when she began slurring her speech.  When [facility] arrived pt was lethargic and confused.  CBG 137, hr 78 nsr with frequent pvc's.  Shallow rr of 30.

## 2018-11-16 NOTE — Consult Note (Signed)
PULMONARY / CRITICAL CARE MEDICINE   NAME:  JALANA MOORE, MRN:  [id number], DOB:  [birth year], LOS: 0 ADMISSION DATE:  11/23/2018, CONSULTATION DATE:  11/22/2018 REFERRING MD:  Neurology CHIEF COMPLAINT:  LOC  BRIEF HISTORY:    Intracerebral hemorrhage HISTORY OF PRESENT ILLNESS   History is obtained from the chart. The patient is intubated and unresponsive.  KENNLEY SCHWANDT is a 85 y.o. Caucasian female with PMH of HTN, remote right breast cancer s/p resection without recurrence, left breast cancer s/p resection. She was told that this was a ductal carcinoma in situ and will not have reccurrence of her breast cancer. She was admitted on 12/05/16 for HA and vision changes. MRI showed left occipital enhancing lesion with hemorrhage, concerning for sbuacute hemorrhagic metastasis. Scheduled to have procedure with Dr. Christella Noa but as per grandson later pt went to [facility] for the surgery and was told the "tumor" was no longer present. The surgery was aborted as per grandson.  She continue follow with oncology regularly for breast cancer monitoring.  Today, patient was talking with grandson over the phone at 2:15 PM.  Suddenly patient was noted to have slurry speech and grandson heard patient saying that she was having a stroke.  EMS called, on arrival, patient had slurred speech and left-sided weakness.  Blood pressure extremely high at 224/103 glucose 137.  However quickly, patient become unresponsive.  Patient had to be bagged into ER.  On arrival, patient difficulty breathing, right pupil ballooning, posturing in all extremities parents mentation, no corneal reflexes, weak gag.  Patient was intubated in ER for airway protection.  CT followed after intubation, showed right hemisphere large ICH with significanct midline shift. SIGNIFICANT PAST MEDICAL HISTORY   1. Breast cancer 2. HTN  SIGNIFICANT EVENTS:   STUDIES:   CT Head 2/10:  CLINICAL DATA:  Code stroke. Slurred speech. This began  earlier today.  EXAM: CT HEAD WITHOUT CONTRAST  TECHNIQUE: Contiguous axial images were obtained from the base of the skull through the vertex without intravenous contrast.  COMPARISON:  MR brain 12/11/2016.  CT head 12/10/2016.  FINDINGS: Brain: Large RIGHT frontal-parietal cerebral hemorrhage, subfalcine shift, marked surrounding edema. Cross-sectional measurements of 74 x 60 x 70 mm corresponds to a volume of approximately 150 mL. RIGHT-to-LEFT shift of 17 mm, possibly greater. RIGHT uncal herniation. Low-attenuation brainstem.  Hemorrhagic LEFT occipital lesion, status post craniotomy, with encephalomalacia. It is unclear if this represented a metastasis, as pathology from the resection did not reveal tumor cells.  Vascular: No hyperdense vessel or unexpected calcification.  Skull: Calvarium intact except for unremarkable appearing craniotomy defect LEFT occipital lobe.  Sinuses/Orbits: Unremarkable.  Other: None.  ASPECTS [facility] Stroke Program Early CT Score)  Noncontributory.  IMPRESSION: Large RIGHT frontal-parietal cerebral hemorrhage, 74 x 60 x 70 mm corresponds to a volume of approximately 150 mL. RIGHT-to-LEFT shift of 17 mm, possibly greater. Neurosurgical consultation may be warranted.  Remote postsurgical change LEFT occipital lobe.  *ASPECTS is not applicable. * Critical Value/emergent results were called by telephone at the time of interpretation on 11/28/2018 at 4:00 pm to Dr. Quintella Reichert , who verbally acknowledged these results.   Electronically Signed   By: Staci Righter M.D.   On: 11/19/2018 16:06    CXR 2/10: CLINICAL DATA:  Status post intubation.  EXAM: PORTABLE CHEST 1 VIEW  COMPARISON:  Single-view of the chest earlier today scratch the single view of the chest 12/12/2016.  FINDINGS: Endotracheal tube tip is in the right mainstem bronchus.  Recommend withdrawal of 4-4.5 cm. NG tube courses into the  stomach and below the inferior margin of the film. Lungs are clear. Heart size is normal. No pneumothorax or pleural effusion.  IMPRESSION: Endotracheal tube tip is in the right mainstem bronchus. Recommend withdrawal of 4-4.5 cm.  NGT courses into the stomach and below the inferior margin the film.  Clear lungs.  Atherosclerosis.  These results were called by telephone at the time of interpretation on 11/25/2018 at 3:46 pm to Dr. Threasa Beards BELFI, who verbally acknowledged these results.   Electronically Signed   By: Inge Rise M.D.   On: 11/15/2018 15:47 CULTURES:    ANTIBIOTICS:    LINES/TUBES:  OGT 2/10 ETT 2/10 CONSULTANTS:  PCCM SUBJECTIVE:  LOC  CONSTITUTIONAL: BP 121/74   Pulse (!) 59   Temp 98.3 F (36.8 C) (Axillary)   Resp 19   Ht 5\' 4"  (1.626 m)   SpO2 98%   BMI 25.16 kg/m   I/O last 3 completed shifts: In: 14.3 [I.V.:14.3] Out: -      Vent Mode: PRVC FiO2 (%):  [100 %] 100 % Set Rate:  [18 bmp] 18 bmp Vt Set:  [430 mL] 430 mL PEEP:  [5 cmH20] 5 cmH20 Plateau Pressure:  [19 cmH20] 19 cmH20  PHYSICAL EXAM: General:  Unresponsive, intubated. Neuro:  Right pupil > left pupil. Decorticate posturing of the upper extremities. GCS: 4 HEENT:  ETT/OGT, no jvd or icterus. Cardiovascular:  Normal s1s2, regular rhythm. No murmur, gallop or rub. Lungs:  Bilateral rhonchi, no wheezes, crackles. Abdomen:  Soft, no organomegaly. Musculoskeletal:  Warm to touch. No cyanosis or pitting edema. Skin:  No mottling.  RESOLVED PROBLEM LIST   ASSESSMENT AND PLAN   1. Acute respiratory failure secondary to [place] 2. HTN 3. History of breast cancer  SUMMARY OF TODAY'S PLAN:  1. Assess position of ETT by cxr; may need to be withdrawn cephalad. 2. ABG's to assess ventilation and oxygenation 3. SCD's for dvt prophylaxis 4. On PPI  Best Practice / Goals of Care / Disposition.   DVT  PROPHYLAXIS:SCD's EQA:STMHDQQI NUTRITION:pending MOBILITY:bedbound GOALS OF CARE:neurological outcome to be discussed with the family by neurology. FAMILY DISCUSSIONS: Discussed ventilator strategy with the daughter and son DISPOSITION remain in the icu  LABS  Glucose Recent Labs  Lab 11/13/2018 2023  GLUCAP 138*    BMET Recent Labs  Lab 11/24/2018 1518  NA 133*  K 3.9  CL 102  CO2 20*  BUN 10  CREATININE 0.78  GLUCOSE 120*    Liver Enzymes Recent Labs  Lab 11/28/2018 1518  AST 33  ALT 17  ALKPHOS 40  BILITOT 0.8  ALBUMIN 3.9    Electrolytes Recent Labs  Lab 11/17/2018 1518  CALCIUM 9.2    CBC Recent Labs  Lab 11/09/2018 1518  WBC 9.3  HGB 13.5  HCT 41.8  PLT 219    ABG No results for input(s): PHART, PCO2ART, PO2ART in the last 168 hours.  Coag's Recent Labs  Lab 12/03/2018 1518  APTT 30  INR 1.02    Sepsis Markers No results for input(s): LATICACIDVEN, PROCALCITON, O2SATVEN in the last 168 hours.  Cardiac Enzymes No results for input(s): TROPONINI, PROBNP in the last 168 hours.  PAST MEDICAL HISTORY :   She  has a past medical history of Anemia, Brain tumor ([place]), Cancer ([place]), Heart murmur, Heartburn, Hypertension, Migraines, and PONV (postoperative nausea and vomiting).  PAST SURGICAL HISTORY:  She  has a past surgical history that includes  Breast surgery; Partial hip replacement; Colonoscopy w/ biopsies and polypectomy; Dilation and curettage of uterus; Craniotomy (Left, 0/0/8676); and Application of cranial navigation (Left, 12/12/2016).  Allergies  Allergen Reactions  . Ace Inhibitors Cough  . Angiotensin Receptor Blockers Cough  . Percocet [Oxycodone-Acetaminophen] Other (See Comments)    "Worms crawling in" patient's head sensation  . Amlodipine Other (See Comments)    Headache    No current facility-administered medications on file prior to encounter.    Current Outpatient Medications on File Prior to Encounter  Medication Sig   . acetaminophen (TYLENOL) 500 MG tablet Take 1,000 mg by mouth every 6 (six) hours as needed (for headaches or pain).  Marland Kitchen amLODipine (NORVASC) 2.5 MG tablet Take 1 tablet (2.5 mg total) by mouth daily.  Marland Kitchen aspirin EC 81 MG tablet Take 81 mg by mouth daily.  . B Complex Vitamins (VITAMIN-B COMPLEX) TABS Take 1 tablet by mouth daily.  . carvedilol (COREG) 12.5 MG tablet Take 12.5 mg by mouth 2 (two) times daily with a meal.  . Cholecalciferol (VITAMIN D-3 PO) Take 1 capsule by mouth daily.  . COCONUT OIL PO Take 15 mLs by mouth See admin instructions. Mixed with a drink once a day  . Coenzyme Q10 100 MG capsule Take 200 mg by mouth 2 (two) times daily.  . Esomeprazole Magnesium (NEXIUM 24HR) 20 MG TBEC Take 1 tablet by mouth daily as needed (for reflux/indigestion).   . Garlic Oil 1950 MG CAPS Take 1,000 mg by mouth daily.  Marland Kitchen GOODSENSE CLEARLAX powder Take 17 g by mouth daily as needed for mild constipation (MIX AND DRINK).   Marland Kitchen levETIRAcetam (KEPPRA) 500 MG tablet Take 1 tablet (500 mg total) by mouth every 12 (twelve) hours.  Marland Kitchen loratadine (CLARITIN) 10 MG tablet Take 10 mg by mouth daily.  . medium chain triglycerides (MCT OIL) oil Take 15 mLs by mouth every other day.   . Multiple Vitamins-Minerals (ONE-A-DAY WOMENS 50+ ADVANTAGE) TABS Take 1 tablet by mouth daily with breakfast.  . nortriptyline (PAMELOR) 25 MG capsule Take 25 mg by mouth at bedtime.  Marland Kitchen omeprazole (PRILOSEC OTC) 20 MG tablet Take 20 mg by mouth daily as needed (for reflux/indigestion).  . pravastatin (PRAVACHOL) 10 MG tablet Take 10 mg by mouth at bedtime.  . riboflavin (VITAMIN B-2) 100 MG TABS tablet Take 100 mg by mouth daily.  . traMADol (ULTRAM) 50 MG tablet Take 1 tablet (50 mg total) by mouth every 6 (six) hours as needed.    FAMILY HISTORY:   Her family history includes Diabetes in her brother and mother; Hypertension in her mother.  SOCIAL HISTORY:  She  reports that she has never smoked. She has never used  smokeless tobacco. She reports that she does not drink alcohol or use drugs.  REVIEW OF SYSTEMS:    Unavailable.  CRITICAL CARE Performed by: Jesus Genera   Total critical care time: 45 minutes  Critical care time was exclusive of separately billable procedures and treating other patients.  Critical care was necessary to treat or prevent imminent or life-threatening deterioration.  Critical care was time spent personally by me on the following activities: development of treatment plan with patient and/or surrogate as well as nursing, discussions with consultants, evaluation of patient's response to treatment, examination of patient, obtaining history from patient or surrogate, ordering and performing treatments and interventions, ordering and review of laboratory studies, ordering and review of radiographic studies, pulse oximetry and re-evaluation of patient's condition.

## 2018-11-16 NOTE — ED Notes (Signed)
Called radiology for post intubation x-ray x 2.

## 2018-11-16 NOTE — Progress Notes (Signed)
RT NOTE:  ETT pulled back to 19cm @ lip following xray.

## 2018-11-16 NOTE — ED Provider Notes (Signed)
[facility] EMERGENCY DEPARTMENT Provider Note   CSN: [id number] Arrival date & time: 11/17/2018  1516   An emergency department physician performed an initial assessment on this suspected stroke patient at 1513(Emiley Digiacomo).  History   Chief Complaint Chief Complaint  Patient presents with  . Code Stroke    HPI Savannah Beltran is a 85 y.o. female.  The history is provided by the EMS personnel and a relative. No language interpreter was used.   Savannah Beltran is a 85 y.o. female who presents to the Emergency Department complaining of code stroke. Level V caveat due to unresponsiveness. History is provided by EMS. EMS states that patient was in her routine state of health today. When she was speaking on the phone with her grandson at 2:30 PM he noted that her speech became slurred. He had a coworker called 911 and he went over to see her. On EMS arrival she had dysarthria speech and left-sided weakness. In route to the hospital her mental status decompensated and she became unresponsive with the decorticate posturing. Past Medical History:  Diagnosis Date  . Anemia    during pregnancy  . Brain tumor ([place])   . Cancer [facility]    Breast Cancer  . Heart murmur   . Heartburn   . Hypertension   . Migraines   . PONV (postoperative nausea and vomiting)     Patient Active Problem List   Diagnosis Date Noted  . Intracerebral hemorrhage ([place]) 12/12/2016  . Hypertension   . ICH (intracerebral hemorrhage) ([place]) 12/05/2016  . Intracranial hemorrhage ([place]) 12/05/2016    Past Surgical History:  Procedure Laterality Date  . APPLICATION OF CRANIAL NAVIGATION Left 12/12/2016   Procedure: APPLICATION OF CRANIAL NAVIGATION;  Surgeon: Ashok Pall, MD;  Location: [place];  Service: Neurosurgery;  Laterality: Left;  . BREAST SURGERY    . COLONOSCOPY W/ BIOPSIES AND POLYPECTOMY    . CRANIOTOMY Left 12/12/2016   Procedure: OCCIPITAL CRANIOTOMY FOR BRAIN TUMOR;  Surgeon: Ashok Pall, MD;   Location: [place];  Service: Neurosurgery;  Laterality: Left;  . DILATION AND CURETTAGE OF UTERUS    . Partial hip replacement       OB History   No obstetric history on file.      Home Medications    Prior to Admission medications   Medication Sig Start Date End Date Taking? Authorizing Provider  acetaminophen (TYLENOL) 500 MG tablet Take 1,000 mg by mouth every 6 (six) hours as needed (for headaches or pain).    [provider]  amLODipine (NORVASC) 2.5 MG tablet Take 1 tablet (2.5 mg total) by mouth daily. 12/07/16   Sela Hilding, MD  aspirin EC 81 MG tablet Take 81 mg by mouth daily.    [provider]  B Complex Vitamins (VITAMIN-B COMPLEX) TABS Take 1 tablet by mouth daily.    [provider]  carvedilol (COREG) 12.5 MG tablet Take 12.5 mg by mouth 2 (two) times daily with a meal.    [provider]  Cholecalciferol (VITAMIN D-3 PO) Take 1 capsule by mouth daily.    [provider]  COCONUT OIL PO Take 15 mLs by mouth See admin instructions. Mixed with a drink once a day    [provider]  Coenzyme Q10 100 MG capsule Take 200 mg by mouth 2 (two) times daily.    [provider]  Esomeprazole Magnesium (NEXIUM 24HR) 20 MG TBEC Take 1 tablet by mouth daily as needed (for  reflux/indigestion).     [provider]  Garlic Oil 1610 MG CAPS Take 1,000 mg by mouth daily.    [provider]  GOODSENSE CLEARLAX powder Take 17 g by mouth daily as needed for mild constipation (MIX AND DRINK).  06/11/18   [provider]  levETIRAcetam (KEPPRA) 500 MG tablet Take 1 tablet (500 mg total) by mouth every 12 (twelve) hours. 12/13/16   Ashok Pall, MD  loratadine (CLARITIN) 10 MG tablet Take 10 mg by mouth daily.    [provider]  medium chain triglycerides (MCT OIL) oil Take 15 mLs by mouth every other day.     [provider]  Multiple Vitamins-Minerals (ONE-A-DAY WOMENS 50+ ADVANTAGE)  TABS Take 1 tablet by mouth daily with breakfast.    [provider]  nortriptyline (PAMELOR) 25 MG capsule Take 25 mg by mouth at bedtime.    [provider]  omeprazole (PRILOSEC OTC) 20 MG tablet Take 20 mg by mouth daily as needed (for reflux/indigestion).    [provider]  pravastatin (PRAVACHOL) 10 MG tablet Take 10 mg by mouth at bedtime. 10/19/16   [provider]  riboflavin (VITAMIN B-2) 100 MG TABS tablet Take 100 mg by mouth daily.    [provider]  traMADol (ULTRAM) 50 MG tablet Take 1 tablet (50 mg total) by mouth every 6 (six) hours as needed. 12/13/16   Ashok Pall, MD    Family History Family History  Problem Relation Age of Onset  . Diabetes Mother   . Hypertension Mother   . Diabetes Brother     Social History Social History   Tobacco Use  . Smoking status: Never Smoker  . Smokeless tobacco: Never Used  Substance Use Topics  . Alcohol use: No  . Drug use: No     Allergies   Ace inhibitors; Angiotensin receptor blockers; Percocet [oxycodone-acetaminophen]; and Amlodipine   Review of Systems Review of Systems  Unable to perform ROS: Patient unresponsive     Physical Exam Updated Vital Signs BP (!) 149/83 Comment: cleviprex restarted  Pulse 63   Temp 98.3 F (36.8 C) (Axillary)   Resp 20   Ht 5' 4"  (1.626 m)   SpO2 100%   BMI 25.16 kg/m   Physical Exam Vitals signs and nursing note reviewed.  Constitutional:      Appearance: She is ill-appearing.     Comments: Unresponsive  HENT:     Head: Normocephalic and atraumatic.  Cardiovascular:     Rate and Rhythm: Normal rate and regular rhythm.     Heart sounds: No murmur.  Pulmonary:     Effort: No respiratory distress.     Breath sounds: Normal breath sounds.  Abdominal:     Palpations: Abdomen is soft.     Tenderness: There is no abdominal tenderness. There is no guarding or rebound.  Musculoskeletal:        General: No swelling or  tenderness.  Skin:    General: Skin is warm.     Capillary Refill: Capillary refill takes less than 2 seconds.     Comments: Diaphoretic  Neurological:     Comments: Unresponsive. Right pupil dilated and unresponsive. Left pupil with 3 to 4 mm and reactive. Decerebrate posturing.  Psychiatric:     Comments: Unable to assess      ED Treatments / Results  Labs (all labs ordered are listed, but only abnormal results are displayed) Labs Reviewed  COMPREHENSIVE METABOLIC PANEL - Abnormal; Notable for  the following components:      Result Value   Sodium 133 (*)    CO2 20 (*)    Glucose, Bld 120 (*)    All other components within normal limits  GLUCOSE, CAPILLARY - Abnormal; Notable for the following components:   Glucose-Capillary 138 (*)    All other components within normal limits  GLUCOSE, CAPILLARY - Abnormal; Notable for the following components:   Glucose-Capillary 117 (*)    All other components within normal limits  POCT I-STAT 7, (LYTES, BLD GAS, ICA,H+H) - Abnormal; Notable for the following components:   Sodium 133 (*)    Potassium 2.9 (*)    All other components within normal limits  MRSA PCR SCREENING  PROTIME-INR  APTT  CBC  DIFFERENTIAL  SODIUM  CBC  BASIC METABOLIC PANEL  BLOOD GAS, ARTERIAL  SODIUM  SODIUM  SODIUM    EKG None  Radiology Dg Chest Port 1 View  Result Date: 11/22/2018 CLINICAL DATA:  ET tube EXAM: PORTABLE CHEST 1 VIEW COMPARISON:  12/04/2018 FINDINGS: Endotracheal tube has been retracted with the tip at the carina. This could be pulled back further approximately 2 cm for optimal positioning. NG tube is in the stomach. Cardiomegaly. Aortic atherosclerosis and ectasia. Left basilar opacity, likely atelectasis. Right lung clear. No effusions. IMPRESSION: Endotracheal tube is at the carina. This could be retracted 2 cm further for optimal positioning. Left base atelectasis or infiltrate. Mild cardiomegaly. Electronically Signed   By: Rolm Baptise M.D.   On: 11/24/2018 20:58   Dg Chest Portable 1 View  Result Date: 12/02/2018 CLINICAL DATA:  Status post intubation. EXAM: PORTABLE CHEST 1 VIEW COMPARISON:  Single-view of the chest earlier today scratch the single view of the chest 12/12/2016. FINDINGS: Endotracheal tube tip is in the right mainstem bronchus. Recommend withdrawal of 4-4.5 cm. NG tube courses into the stomach and below the inferior margin of the film. Lungs are clear. Heart size is normal. No pneumothorax or pleural effusion. IMPRESSION: Endotracheal tube tip is in the right mainstem bronchus. Recommend withdrawal of 4-4.5 cm. NGT courses into the stomach and below the inferior margin the film. Clear lungs. Atherosclerosis. These results were called by telephone at the time of interpretation on 11/14/2018 at 3:46 pm to Dr. Threasa Beards BELFI, who verbally acknowledged these results. Electronically Signed   By: Inge Rise M.D.   On: 12/04/2018 15:47   Ct Head Code Stroke Wo Contrast  Result Date: 11/13/2018 CLINICAL DATA:  Code stroke. Slurred speech. This began earlier today. EXAM: CT HEAD WITHOUT CONTRAST TECHNIQUE: Contiguous axial images were obtained from the base of the skull through the vertex without intravenous contrast. COMPARISON:  MR brain 12/11/2016.  CT head 12/10/2016. FINDINGS: Brain: Large RIGHT frontal-parietal cerebral hemorrhage, subfalcine shift, marked surrounding edema. Cross-sectional measurements of 74 x 60 x 70 mm corresponds to a volume of approximately 150 mL. RIGHT-to-LEFT shift of 17 mm, possibly greater. RIGHT uncal herniation. Low-attenuation brainstem. Hemorrhagic LEFT occipital lesion, status post craniotomy, with encephalomalacia. It is unclear if this represented a metastasis, as pathology from the resection did not reveal tumor cells. Vascular: No hyperdense vessel or unexpected calcification. Skull: Calvarium intact except for unremarkable appearing craniotomy defect LEFT occipital lobe.  Sinuses/Orbits: Unremarkable. Other: None. ASPECTS [facility] Stroke Program Early CT Score) Noncontributory. IMPRESSION: Large RIGHT frontal-parietal cerebral hemorrhage, 74 x 60 x 70 mm corresponds to a volume of approximately 150 mL. RIGHT-to-LEFT shift of 17 mm, possibly greater. Neurosurgical consultation may be warranted. Remote postsurgical  change LEFT occipital lobe. *ASPECTS is not applicable. * Critical Value/emergent results were called by telephone at the time of interpretation on 11/10/2018 at 4:00 pm to Dr. Quintella Reichert , who verbally acknowledged these results. Electronically Signed   By: Staci Righter M.D.   On: 11/13/2018 16:06    Procedures Procedure Name: Intubation Date/Time: 11/11/2018 4:11 PM Performed by: Quintella Reichert, MD Pre-anesthesia Checklist: Patient identified, Patient being monitored, Emergency Drugs available, Timeout performed and Suction available Oxygen Delivery Method: Non-rebreather mask Preoxygenation: Pre-oxygenation with 100% oxygen Induction Type: Rapid sequence Ventilation: Mask ventilation without difficulty Laryngoscope Size: Glidescope Tube size: 7.5 mm Number of attempts: 1 Placement Confirmation: ETT inserted through vocal cords under direct vision,  CO2 detector and Breath sounds checked- equal and bilateral Secured at: 22 cm Tube secured with: ETT holder Dental Injury: Teeth and Oropharynx as per pre-operative assessment  Comments: Chest x-ray demonstrates endotracheal tube is deep, ETT was retracted.      (including critical care time) CRITICAL CARE Performed by: Quintella Reichert   Total critical care time: 35 minutes  Critical care time was exclusive of separately billable procedures and treating other patients.  Critical care was necessary to treat or prevent imminent or life-threatening deterioration.  Critical care was time spent personally by me on the following activities: development of treatment plan with patient and/or  surrogate as well as nursing, discussions with consultants, evaluation of patient's response to treatment, examination of patient, obtaining history from patient or surrogate, ordering and performing treatments and interventions, ordering and review of laboratory studies, ordering and review of radiographic studies, pulse oximetry and re-evaluation of patient's condition.  Medications Ordered in ED Medications  fentaNYL (SUBLIMAZE) 100 MCG/2ML injection (has no administration in time range)   stroke: mapping our early stages of recovery book (has no administration in time range)  senna-docusate (Senokot-S) tablet 1 tablet (1 tablet Oral Not Given 11/12/2018 2309)  pantoprazole (PROTONIX) injection 40 mg (40 mg Intravenous Given 11/23/2018 2148)  clevidipine (CLEVIPREX) infusion 0.5 mg/mL (2 mg/hr Intravenous Rate/Dose Verify 11/17/18 0000)  insulin aspart (novoLOG) injection 0-9 Units (0 Units Subcutaneous Not Given 11/09/2018 2308)  fentaNYL 2539mg in NS 2563m(1022mml) infusion-PREMIX (75 mcg/hr Intravenous Rate/Dose Verify 11/17/18 0000)  chlorhexidine gluconate (MEDLINE KIT) (PERIDEX) 0.12 % solution 15 mL (15 mLs Mouth Rinse Given 11/12/2018 2000)  MEDLINE mouth rinse (15 mLs Mouth Rinse Given 11/17/2018 2157)  sodium chloride (hypertonic) 3 % solution ( Intravenous Rate/Dose Verify 11/17/18 0000)  potassium chloride 20 MEQ/15ML (10%) solution 40 mEq (40 mEq Oral Given 11/11/2018 2147)  sodium chloride flush (NS) 0.9 % injection 3 mL (3 mLs Intravenous Given 11/09/2018 1636)  etomidate (AMIDATE) injection (20 mg Intravenous Given 11/26/2018 1520)  succinylcholine (ANECTINE) injection (100 mg Intravenous Given 12/02/2018 1521)  clevidipine (CLEVIPREX) 0.5 MG/ML infusion (6 mg/hr  Rate/Dose Change 11/20/2018 1616)  fentaNYL (SUBLIMAZE) injection (100 mcg Intravenous Given 11/27/2018 1612)     Initial Impression / Assessment and Plan / ED Course  I have reviewed the triage vital signs and the nursing notes.  Pertinent  labs & imaging results that were available during my care of the patient were reviewed by me and considered in my medical decision making (see chart for details).     Patient presents to the emergency department for altered mental status, code stroke. Patient is unresponsive on ED arrival with does cerebral posturing. She was intubated for airway protection and transferred emergently to the CT scanner. CT demonstrates intracranial hemorrhage with midline shift. Patient  was evaluated by neurology on ED arrival to the emergency department. Case was discussed with patient's family. Plan to admit for further management.  Final Clinical Impressions(s) / ED Diagnoses   Final diagnoses:  Endotracheally intubated  Multiple right-sided nontraumatic localized intracerebral hemorrhages [facility])    ED Discharge Orders    None       Quintella Reichert, MD 11/17/18 [phone]

## 2018-11-16 NOTE — Code Documentation (Signed)
85yo female presented to [facility] via [place] at [phone]. Patient was talking on the phone with her grandson at 3 when she developed sudden onset slurred speech. EMS was called and assessed patient to have altered mental status and activated a code stroke. Patient required BVM ventilation en route. Stroke team at the bedside on patient arrival. Patient to resus room where she was intubated. NIHSS 28, see documentation for details and code stroke times. Patient with dilated right pupil and posturing on exam. Patient to CT showing large right ICH. Patient transported back to ED. Cleviprex gtt started to keep SBP <140. Patient to be admitted to ICU. Bedside handoff with ED RN Hassan Rowan.

## 2018-11-16 NOTE — H&P (Signed)
Stroke Neurology Admission Note  The history was obtained from the grandson, EMS staff.  During history and examination, all items were able to obtain unless otherwise noted.  History of Present Illness:  Savannah Beltran is a 85 y.o. Caucasian female with PMH of HTN, remote right breast cancer s/p resection without recurrence, left breast cancer s/p resection. She was told that this was a ductal carcinoma in situ and will not have reccurrence of her breast cancer. She was admitted on 12/05/16 for HA and vision changes. MRI showed left occipital enhancing lesion with hemorrhage, concerning for sbuacute hemorrhagic metastasis. Scheduled to have procedure with Dr. Christella Noa but as per grandson later pt went to [facility] for the surgery and was told the "tumor" was no longer present. The surgery was aborted as per grandson.  She continue follow with oncology regularly for breast cancer monitoring.  Today, patient was talking with grandson over the phone at 2:15 PM.  Suddenly patient was noted to have slurry speech and grandson heard patient saying that she was having a stroke.  EMS called, on arrival, patient had slurred speech and left-sided weakness.  Blood pressure extremely high at 224/103 glucose 137.  However quickly, patient become unresponsive.  Patient had to be bagged into ER.  On arrival, patient difficulty breathing, right pupil ballooning, posturing in all extremities parents mentation, no corneal reflexes, weak gag.  Patient was intubated in ER for airway protection.  CT followed after intubation, showed right hemisphere large ICH with significanct midline shift.  LSN: 2:15 PM tPA Given: No: ICH  Past Medical History:  Diagnosis Date  . Anemia    during pregnancy  . Brain tumor ([place])   . Cancer [facility])    Breast Cancer  . Heart murmur   . Heartburn   . Hypertension   . Migraines   . PONV (postoperative nausea and vomiting)     Past Surgical History:  Procedure Laterality Date  .  APPLICATION OF CRANIAL NAVIGATION Left 12/12/2016   Procedure: APPLICATION OF CRANIAL NAVIGATION;  Surgeon: Ashok Pall, MD;  Location: [place];  Service: Neurosurgery;  Laterality: Left;  . BREAST SURGERY    . COLONOSCOPY W/ BIOPSIES AND POLYPECTOMY    . CRANIOTOMY Left 12/12/2016   Procedure: OCCIPITAL CRANIOTOMY FOR BRAIN TUMOR;  Surgeon: Ashok Pall, MD;  Location: [place];  Service: Neurosurgery;  Laterality: Left;  . DILATION AND CURETTAGE OF UTERUS    . Partial hip replacement      Family History  Problem Relation Age of Onset  . Diabetes Mother   . Hypertension Mother   . Diabetes Brother     Social History:  reports that she has never smoked. She has never used smokeless tobacco. She reports that she does not drink alcohol or use drugs.  Allergies:  Allergies  Allergen Reactions  . Ace Inhibitors Cough  . Angiotensin Receptor Blockers Cough  . Percocet [Oxycodone-Acetaminophen] Other (See Comments)    "Worms crawling in" patient's head sensation  . Amlodipine Other (See Comments)    Headache    No current facility-administered medications on file prior to encounter.    Current Outpatient Medications on File Prior to Encounter  Medication Sig Dispense Refill  . acetaminophen (TYLENOL) 500 MG tablet Take 1,000 mg by mouth every 6 (six) hours as needed (for headaches or pain).    Marland Kitchen amLODipine (NORVASC) 2.5 MG tablet Take 1 tablet (2.5 mg total) by mouth daily. 30 tablet 0  . aspirin EC 81 MG tablet  Take 81 mg by mouth daily.    . B Complex Vitamins (VITAMIN-B COMPLEX) TABS Take 1 tablet by mouth daily.    . carvedilol (COREG) 12.5 MG tablet Take 12.5 mg by mouth 2 (two) times daily with a meal.    . Cholecalciferol (VITAMIN D-3 PO) Take 1 capsule by mouth daily.    . COCONUT OIL PO Take 15 mLs by mouth See admin instructions. Mixed with a drink once a day    . Coenzyme Q10 100 MG capsule Take 200 mg by mouth 2 (two) times daily.    . Esomeprazole Magnesium (NEXIUM 24HR) 20  MG TBEC Take 1 tablet by mouth daily as needed (for reflux/indigestion).     . Garlic Oil 6295 MG CAPS Take 1,000 mg by mouth daily.    Marland Kitchen GOODSENSE CLEARLAX powder Take 17 g by mouth daily as needed for mild constipation (MIX AND DRINK).     Marland Kitchen levETIRAcetam (KEPPRA) 500 MG tablet Take 1 tablet (500 mg total) by mouth every 12 (twelve) hours. 60 tablet 1  . loratadine (CLARITIN) 10 MG tablet Take 10 mg by mouth daily.    . medium chain triglycerides (MCT OIL) oil Take 15 mLs by mouth every other day.     . Multiple Vitamins-Minerals (ONE-A-DAY WOMENS 50+ ADVANTAGE) TABS Take 1 tablet by mouth daily with breakfast.    . nortriptyline (PAMELOR) 25 MG capsule Take 25 mg by mouth at bedtime.    Marland Kitchen omeprazole (PRILOSEC OTC) 20 MG tablet Take 20 mg by mouth daily as needed (for reflux/indigestion).    . pravastatin (PRAVACHOL) 10 MG tablet Take 10 mg by mouth at bedtime.    . riboflavin (VITAMIN B-2) 100 MG TABS tablet Take 100 mg by mouth daily.    . traMADol (ULTRAM) 50 MG tablet Take 1 tablet (50 mg total) by mouth every 6 (six) hours as needed. 20 tablet 0    Review of Systems: A full ROS was attempted today and was not able to be performed.    Physical Examination: Temp:  [97.4 F (36.3 C)] 97.4 F (36.3 C) (02/10 1619) Pulse Rate:  [65] 65 (02/10 1518) Resp:  [18-24] 24 (02/10 1556) BP: (165)/(139) 165/139 (02/10 1518) SpO2:  [100 %] 100 % (02/10 1556) FiO2 (%):  [100 %] 100 % (02/10 1556)  General - well nourished, well developed, intubated not on sedation.    Ophthalmologic - fundi not visualized due to noncooperation.    Cardiovascular - regular rate and rhythm, bradycardia  Neuro - intubated not on sedation, eyes closed, not following commands. With forced eye opening, eyes in middle position, not blinking to visual threat, doll's eyes absent, not tracking, right pupil 5 mm, nonreactive, left pupil 2.5 mm, not reactive to light. Corneal reflex absent, gag weakly present. Breathing  not over the vent.  Facial symmetry not able to test due to ET tube.  Tongue midline in mouth. On pain stimulation, decerebrate posturing in all extremities. DTR diminished and no babinski. Sensation, coordination and gait not tested.  Data Reviewed: Dg Chest Portable 1 View  Result Date: 11/21/2018 CLINICAL DATA:  Status post intubation. EXAM: PORTABLE CHEST 1 VIEW COMPARISON:  Single-view of the chest earlier today scratch the single view of the chest 12/12/2016. FINDINGS: Endotracheal tube tip is in the right mainstem bronchus. Recommend withdrawal of 4-4.5 cm. NG tube courses into the stomach and below the inferior margin of the film. Lungs are clear. Heart size is normal. No pneumothorax or pleural effusion.  IMPRESSION: Endotracheal tube tip is in the right mainstem bronchus. Recommend withdrawal of 4-4.5 cm. NGT courses into the stomach and below the inferior margin the film. Clear lungs. Atherosclerosis. These results were called by telephone at the time of interpretation on 12/02/2018 at 3:46 pm to Dr. Threasa Beards BELFI, who verbally acknowledged these results. Electronically Signed   By: Inge Rise M.D.   On: 11/10/2018 15:47   Ct Head Code Stroke Wo Contrast  Result Date: 11/26/2018 CLINICAL DATA:  Code stroke. Slurred speech. This began earlier today. EXAM: CT HEAD WITHOUT CONTRAST TECHNIQUE: Contiguous axial images were obtained from the base of the skull through the vertex without intravenous contrast. COMPARISON:  MR brain 12/11/2016.  CT head 12/10/2016. FINDINGS: Brain: Large RIGHT frontal-parietal cerebral hemorrhage, subfalcine shift, marked surrounding edema. Cross-sectional measurements of 74 x 60 x 70 mm corresponds to a volume of approximately 150 mL. RIGHT-to-LEFT shift of 17 mm, possibly greater. RIGHT uncal herniation. Low-attenuation brainstem. Hemorrhagic LEFT occipital lesion, status post craniotomy, with encephalomalacia. It is unclear if this represented a metastasis, as  pathology from the resection did not reveal tumor cells. Vascular: No hyperdense vessel or unexpected calcification. Skull: Calvarium intact except for unremarkable appearing craniotomy defect LEFT occipital lobe. Sinuses/Orbits: Unremarkable. Other: None. ASPECTS [facility] Stroke Program Early CT Score) Noncontributory. IMPRESSION: Large RIGHT frontal-parietal cerebral hemorrhage, 74 x 60 x 70 mm corresponds to a volume of approximately 150 mL. RIGHT-to-LEFT shift of 17 mm, possibly greater. Neurosurgical consultation may be warranted. Remote postsurgical change LEFT occipital lobe. *ASPECTS is not applicable. * Critical Value/emergent results were called by telephone at the time of interpretation on 11/29/2018 at 4:00 pm to Dr. Quintella Reichert , who verbally acknowledged these results. Electronically Signed   By: Staci Righter M.D.   On: 11/25/2018 16:06    Assessment: 85 y.o. female with history of hypertension, bilateral breast cancer status post resection, left occipital brain lesion admitted for slurred speech, left-sided weakness and unresponsive.  Required intubation for airway protection.  Exam showed right pupil ballooning with decerebrate posturing.  CT showed right large ICH with significant midline shift, uncal herniation, unsurvivable.  Updated husband with imaging findings, clinical presentation, and poor prognosis.  Family requested DNR but will defer GOC at this time.  Palliative care consulted.  Stroke Risk Factors - hypertension  Plan: - Admit to ICU for [facility] care -CT showed right large ICA with significant midline shift, unsurvivable -CODE STATUS DNR -Family will defer GOC at this time -Palliative care consulted for further discussion with GOC -BP goal less than 140, on Cleviprex for BP titration -IV fluid NS at 50 cc/h -CBG monitoring, SSI -poor prognosis  This patient is critically ill due to right large ICH, hypertensive emergency, respiratory failure and at significant risk of  neurological worsening, death form cerebral edema, brain herniation. This patient's care requires constant monitoring of vital signs, hemodynamics, respiratory and cardiac monitoring, review of multiple databases, neurological assessment, discussion with family, other specialists and medical decision making of high complexity. I spent 55 minutes of neurocritical care time in the care of this patient. I had long discussion with husband, daughter and grandson at bedside, updated pt current condition, treatment plan and potential prognosis. They expressed understanding and appreciation.   Rosalin Hawking, MD PhD Stroke Neurology 11/08/2018 4:39 PM

## 2018-11-17 ENCOUNTER — Inpatient Hospital Stay (HOSPITAL_COMMUNITY): Payer: Medicare HMO

## 2018-11-17 DIAGNOSIS — I610 Nontraumatic intracerebral hemorrhage in hemisphere, subcortical: Secondary | ICD-10-CM

## 2018-11-17 DIAGNOSIS — J9600 Acute respiratory failure, unspecified whether with hypoxia or hypercapnia: Secondary | ICD-10-CM

## 2018-11-17 DIAGNOSIS — Z978 Presence of other specified devices: Secondary | ICD-10-CM

## 2018-11-17 DIAGNOSIS — G935 Compression of brain: Secondary | ICD-10-CM

## 2018-11-17 DIAGNOSIS — I615 Nontraumatic intracerebral hemorrhage, intraventricular: Secondary | ICD-10-CM

## 2018-11-17 DIAGNOSIS — R0689 Other abnormalities of breathing: Secondary | ICD-10-CM

## 2018-11-17 LAB — CBC
HCT: 43.4 % (ref 36.0–46.0)
HCT: 46.1 % — ABNORMAL HIGH (ref 36.0–46.0)
HEMOGLOBIN: 14.9 g/dL (ref 12.0–15.0)
Hemoglobin: 14.3 g/dL (ref 12.0–15.0)
MCH: 27.7 pg (ref 26.0–34.0)
MCH: 27.1 pg (ref 26.0–34.0)
MCHC: 32.9 g/dL (ref 30.0–36.0)
MCHC: 32.3 g/dL (ref 30.0–36.0)
MCV: 83.9 fL (ref 80.0–100.0)
MCV: 84 fL (ref 80.0–100.0)
Platelets: 213 10*3/uL (ref 150–400)
Platelets: 224 10*3/uL (ref 150–400)
RBC: 5.17 MIL/uL — ABNORMAL HIGH (ref 3.87–5.11)
RBC: 5.49 MIL/uL — ABNORMAL HIGH (ref 3.87–5.11)
RDW: 13.6 % (ref 11.5–15.5)
RDW: 13.9 % (ref 11.5–15.5)
WBC: 16.2 10*3/uL — ABNORMAL HIGH (ref 4.0–10.5)
WBC: 23 10*3/uL — ABNORMAL HIGH (ref 4.0–10.5)
nRBC: 0 % (ref 0.0–0.2)
nRBC: 0 % (ref 0.0–0.2)

## 2018-11-17 LAB — POCT I-STAT 7, (LYTES, BLD GAS, ICA,H+H)
Acid-base deficit: 2 mmol/L (ref 0.0–2.0)
Bicarbonate: 22 mmol/L (ref 20.0–28.0)
Calcium, Ion: 1.34 mmol/L (ref 1.15–1.40)
HCT: 39 % (ref 36.0–46.0)
Hemoglobin: 13.3 g/dL (ref 12.0–15.0)
O2 SAT: 98 %
PH ART: 7.412 (ref 7.350–7.450)
Patient temperature: 99.4
Potassium: 3.4 mmol/L — ABNORMAL LOW (ref 3.5–5.1)
Sodium: 151 mmol/L — ABNORMAL HIGH (ref 135–145)
TCO2: 23 mmol/L (ref 22–32)
pCO2 arterial: 34.7 mmHg (ref 32.0–48.0)
pO2, Arterial: 105 mmHg (ref 83.0–108.0)

## 2018-11-17 LAB — GLUCOSE, CAPILLARY
Glucose-Capillary: 124 mg/dL — ABNORMAL HIGH (ref 70–99)
Glucose-Capillary: 114 mg/dL — ABNORMAL HIGH (ref 70–99)
Glucose-Capillary: 116 mg/dL — ABNORMAL HIGH (ref 70–99)
Glucose-Capillary: 134 mg/dL — ABNORMAL HIGH (ref 70–99)

## 2018-11-17 LAB — BASIC METABOLIC PANEL
Anion gap: 14 (ref 5–15)
BUN: 8 mg/dL (ref 8–23)
CO2: 21 mmol/L — ABNORMAL LOW (ref 22–32)
Calcium: 9.3 mg/dL (ref 8.9–10.3)
Chloride: 108 mmol/L (ref 98–111)
Creatinine, Ser: 0.71 mg/dL (ref 0.44–1.00)
GFR calc non Af Amer: >60 mL/min (ref >60)
Glucose, Bld: 124 mg/dL — ABNORMAL HIGH (ref 70–99)
Potassium: 3.5 mmol/L (ref 3.5–5.1)
SODIUM: 143 mmol/L (ref 135–145)

## 2018-11-17 LAB — BLOOD GAS, ARTERIAL
Acid-base deficit: 3.1 mmol/L — ABNORMAL HIGH (ref 0.0–2.0)
Bicarbonate: 20.7 mmol/L (ref 20.0–28.0)
Drawn by: 406621
FIO2: 40
MECHVT: 430 mL
O2 Saturation: 99 %
PEEP: 5 cmH2O
Patient temperature: 98.6
RATE: 18 resp/min
pCO2 arterial: 32.7 mmHg (ref 32.0–48.0)
pH, Arterial: 7.417 (ref 7.350–7.450)
pO2, Arterial: 168 mmHg — ABNORMAL HIGH (ref 83.0–108.0)

## 2018-11-17 LAB — SODIUM
Sodium: 149 mmol/L — ABNORMAL HIGH (ref 135–145)
Sodium: 151 mmol/L — ABNORMAL HIGH (ref 135–145)

## 2018-11-17 MED ORDER — ACETAMINOPHEN 160 MG/5ML PO SOLN
650.0000 mg | Freq: Four times a day (QID) | ORAL | Status: DC | PRN
  Administered 2018-11-18 (×2): 650 mg
  Filled 2018-11-17 (×2): qty 20.3

## 2018-11-17 MED ORDER — SODIUM CHLORIDE 3 % IV SOLN
INTRAVENOUS | Status: DC
  Administered 2018-11-18: 25 mL/h via INTRAVENOUS
  Filled 2018-11-17 (×3): qty 500

## 2018-11-17 NOTE — Progress Notes (Signed)
Dr. Rory Percy made aware of sodium rise. Instructed by MD to keep 3% at 50 mL/hr.

## 2018-11-17 NOTE — Progress Notes (Signed)
RT NOTE:  Pt transported to CT and back to 4N25 without event.

## 2018-11-17 NOTE — Progress Notes (Addendum)
STROKE TEAM PROGRESS NOTE   SUBJECTIVE (INTERVAL HISTORY) Her son and daughter as well as husband are at the bedside. Pt still intubated, not responsive to voice. Slight withdraw at right leg with pain. Pupil dilated b/l, weak corneal and cough. Repeat CT showed large right ICH with increased mid line shift and severe brain herniation as well as IVH. Family meeting held in the hallway, husband insisted aggressive care at this time.    OBJECTIVE Temp:  [97.8 F (36.6 C)-100.5 F (38.1 C)] 99.4 F (37.4 C) (02/11 1600) Pulse Rate:  [56-83] 66 (02/11 1645) Cardiac Rhythm: Normal sinus rhythm (02/11 1600) Resp:  [15-23] 23 (02/11 1645) BP: (111-165)/(65-92) 129/74 (02/11 1645) SpO2:  [96 %-100 %] 100 % (02/11 1645) FiO2 (%):  [40 %-100 %] 40 % (02/11 1600)  Recent Labs  Lab 11/24/2018 2337 11/17/18 0346 11/17/18 0810 11/17/18 1128 11/17/18 1540  GLUCAP 117* 124* 114* 116* 134*   Recent Labs  Lab 11/21/2018 1518 11/10/2018 2049 11/30/2018 2148 11/17/18 0441 11/17/18 1447  NA 133* 133* 135 143 149*  K 3.9 2.9*  --  3.5  --   CL 102  --   --  108  --   CO2 20*  --   --  21*  --   GLUCOSE 120*  --   --  124*  --   BUN 10  --   --  8  --   CREATININE 0.78  --   --  0.71  --   CALCIUM 9.2  --   --  9.3  --    Recent Labs  Lab 11/30/2018 1518  AST 33  ALT 17  ALKPHOS 40  BILITOT 0.8  PROT 6.8  ALBUMIN 3.9   Recent Labs  Lab 11/13/2018 1518 11/17/2018 2049 11/17/18 0441  WBC 9.3  --  16.2*  NEUTROABS 4.7  --   --   HGB 13.5 13.9 14.3  HCT 41.8 41.0 43.4  MCV 84.1  --  83.9  PLT 219  --  213   No results for input(s): CKTOTAL, CKMB, CKMBINDEX, TROPONINI in the last 168 hours. Recent Labs    11/12/2018 1518  LABPROT 13.3  INR 1.02   No results for input(s): COLORURINE, LABSPEC, PHURINE, GLUCOSEU, HGBUR, BILIRUBINUR, KETONESUR, PROTEINUR, UROBILINOGEN, NITRITE, LEUKOCYTESUR in the last 72 hours.  Invalid input(s): APPERANCEUR  No results found for: CHOL, TRIG, HDL,  CHOLHDL, VLDL, LDLCALC No results found for: HGBA1C    Component Value Date/Time   LABOPIA NONE DETECTED 12/05/2016 1743   COCAINSCRNUR NONE DETECTED 12/05/2016 1743   LABBENZ NONE DETECTED 12/05/2016 1743   AMPHETMU NONE DETECTED 12/05/2016 1743   THCU NONE DETECTED 12/05/2016 1743   LABBARB POSITIVE (A) 12/05/2016 1743    No results for input(s): ETH in the last 168 hours.  I have personally reviewed the radiological images below and agree with the radiology interpretations.  Ct Head Wo Contrast  Result Date: 11/17/2018 CLINICAL DATA:  85 y/o  F; intracranial hemorrhage for follow-up. EXAM: CT HEAD WITHOUT CONTRAST TECHNIQUE: Contiguous axial images were obtained from the base of the skull through the vertex without intravenous contrast. COMPARISON:  11/13/2018 CT head. 12/11/2016 MRI head. FINDINGS: Brain: Interval increased size of brain parenchymal hemorrhage centered within the right parietal lobe measuring 7.0 x 8.4 cm in the axial plane, previously 6.5 x 7.4 cm. Increased decussation of hemorrhage via the corpus callosum to the contralateral left cingulate gyrus. Interval extension of hemorrhage into the ventricles and subarachnoid space.  Increased right-to-left midline shift of 20 mm. Left ventricular entrapment. Effacement of the suprasellar cistern and right greater than left quadrigeminal plate cisterns. Progression of downward transtentorial herniation. No downward herniation via the foramen magnum at this time. Stable chronic infarct of left occipital lobe. Vascular: No hyperdense vessel or unexpected calcification. Skull: Normal. Negative for fracture or focal lesion. Sinuses/Orbits: No acute finding. Other: None. IMPRESSION: 1. Interval increased size of large right parietal lobe hemorrhage with increased decussation to the contralateral brain via the corpus callosum. 2. Interval extension of hemorrhage into the ventricles and subarachnoid space. 3. Increased right-to-left midline  shift of 20 mm. Increased left ventricular entrapment. Increased effacement of basilar cisterns and downward transtentorial herniation. These results will be called to the ordering clinician or representative by the Radiologist Assistant, and communication documented in the PACS or zVision Dashboard. Electronically Signed   By: Kristine Garbe M.D.   On: 11/17/2018 05:24   Dg Chest Port 1 View  Result Date: 11/17/2018 CLINICAL DATA:  Respiratory failure. EXAM: PORTABLE CHEST 1 VIEW COMPARISON:  Radiograph of November 16, 2018. FINDINGS: Stable cardiomediastinal silhouette. Atherosclerosis of thoracic aorta is noted. Endotracheal and nasogastric tubes are in grossly good position. No pneumothorax or pleural effusion is noted. Right lung is clear. Mild left basilar atelectasis or infiltrate is noted. IMPRESSION: Mild left basilar subsegmental atelectasis or infiltrate is noted. Endotracheal and nasogastric tubes in grossly good position. Aortic Atherosclerosis (ICD10-I70.0). Electronically Signed   By: Marijo Conception, M.D.   On: 11/17/2018 07:42   Dg Chest Port 1 View  Result Date: 11/27/2018 CLINICAL DATA:  ET tube EXAM: PORTABLE CHEST 1 VIEW COMPARISON:  11/08/2018 FINDINGS: Endotracheal tube has been retracted with the tip at the carina. This could be pulled back further approximately 2 cm for optimal positioning. NG tube is in the stomach. Cardiomegaly. Aortic atherosclerosis and ectasia. Left basilar opacity, likely atelectasis. Right lung clear. No effusions. IMPRESSION: Endotracheal tube is at the carina. This could be retracted 2 cm further for optimal positioning. Left base atelectasis or infiltrate. Mild cardiomegaly. Electronically Signed   By: Rolm Baptise M.D.   On: 11/07/2018 20:58   Dg Chest Portable 1 View  Result Date: 11/14/2018 CLINICAL DATA:  Status post intubation. EXAM: PORTABLE CHEST 1 VIEW COMPARISON:  Single-view of the chest earlier today scratch the single view of the  chest 12/12/2016. FINDINGS: Endotracheal tube tip is in the right mainstem bronchus. Recommend withdrawal of 4-4.5 cm. NG tube courses into the stomach and below the inferior margin of the film. Lungs are clear. Heart size is normal. No pneumothorax or pleural effusion. IMPRESSION: Endotracheal tube tip is in the right mainstem bronchus. Recommend withdrawal of 4-4.5 cm. NGT courses into the stomach and below the inferior margin the film. Clear lungs. Atherosclerosis. These results were called by telephone at the time of interpretation on 12/04/2018 at 3:46 pm to Dr. Threasa Beards BELFI, who verbally acknowledged these results. Electronically Signed   By: Inge Rise M.D.   On: 11/25/2018 15:47   Ct Head Code Stroke Wo Contrast  Result Date: 11/17/2018 CLINICAL DATA:  Code stroke. Slurred speech. This began earlier today. EXAM: CT HEAD WITHOUT CONTRAST TECHNIQUE: Contiguous axial images were obtained from the base of the skull through the vertex without intravenous contrast. COMPARISON:  MR brain 12/11/2016.  CT head 12/10/2016. FINDINGS: Brain: Large RIGHT frontal-parietal cerebral hemorrhage, subfalcine shift, marked surrounding edema. Cross-sectional measurements of 74 x 60 x 70 mm corresponds to a volume of approximately 150  mL. RIGHT-to-LEFT shift of 17 mm, possibly greater. RIGHT uncal herniation. Low-attenuation brainstem. Hemorrhagic LEFT occipital lesion, status post craniotomy, with encephalomalacia. It is unclear if this represented a metastasis, as pathology from the resection did not reveal tumor cells. Vascular: No hyperdense vessel or unexpected calcification. Skull: Calvarium intact except for unremarkable appearing craniotomy defect LEFT occipital lobe. Sinuses/Orbits: Unremarkable. Other: None. ASPECTS [facility] Stroke Program Early CT Score) Noncontributory. IMPRESSION: Large RIGHT frontal-parietal cerebral hemorrhage, 74 x 60 x 70 mm corresponds to a volume of approximately 150 mL. RIGHT-to-LEFT  shift of 17 mm, possibly greater. Neurosurgical consultation may be warranted. Remote postsurgical change LEFT occipital lobe. *ASPECTS is not applicable. * Critical Value/emergent results were called by telephone at the time of interpretation on 11/17/2018 at 4:00 pm to Dr. Quintella Reichert , who verbally acknowledged these results. Electronically Signed   By: Staci Righter M.D.   On: 11/22/2018 16:06     PHYSICAL EXAM  Temp:  [97.8 F (36.6 C)-100.5 F (38.1 C)] 99.4 F (37.4 C) (02/11 1600) Pulse Rate:  [56-83] 66 (02/11 1645) Resp:  [15-23] 23 (02/11 1645) BP: (111-165)/(65-92) 129/74 (02/11 1645) SpO2:  [96 %-100 %] 100 % (02/11 1645) FiO2 (%):  [40 %-100 %] 40 % (02/11 1600)  General - well nourished, well developed, intubated not on sedation.    Ophthalmologic - fundi not visualized due to corneal clouding.    Cardiovascular - regular rate and rhythm, bradycardia  Neuro - intubated not on sedation, eyes closed, not following commands. With forced eye opening, eyes in middle position, not blinking to visual threat, doll's eyes absent, not tracking, right pupil 5 mm, nonreactive, left pupil 4 mm, not reactive to light. Corneal reflex weakly present, gag weakly present. Breathing over the vent.  Facial symmetry not able to test due to ET tube.  Tongue midline in mouth. On pain stimulation, decerebrate posturing in upper extremities, but mild withdraw on the right LE. DTR diminished and no babinski. Sensation, coordination and gait not tested.   ASSESSMENT/PLAN Ms. VIANA SLEEP is a 85 y.o. female with history of hypertension, bilateral breast cancer status post resection, previous left occipital brain lesion admitted for slurred speech, left-sided weakness and unresponsive.  Required intubation for airway protection.  Exam showed uncal herniation.  CT showed right large ICH with significant midline shift, uncal herniation, unsurvivable. Updated husband with imaging findings, clinical  presentation, and poor prognosis.  husband agreed with DNR but insisted aggressive care at this time.    ICH:  right hemisphere large ICH with significant midline shift and uncal herniation, IVH, likely hypertensive bleed  CT head right large hemisphere ICH with midline shift, uncal herniation  CT repeat large right hemisphere ICH with increased midline shift and uncal herniation. IVH became noticeable.   SCDs for VTE prophylaxis  aspirin 81 mg daily prior to admission, now on No antithrombotic.   Disposition:  unsurvivable ICH - discussed with family, updated condition and treatment options. Husband insisted aggressive care at this time  Uncal herniation  Anisocoria  CT confirmation  On 3% saline  Na  133-143-149  Na goal 150-155  Na Q6h  Not candidate for NSG  Hypertensive emergency . Stable . On cleviprex  Long term BP goal < 140  Left occipital lesion  12/2016 MRI with and without contrast concerning for metastatic brain tumor as pt has hx of breast cancer   Had craniotomy with Dr. Christella Noa but found no tumor during the surgery  CT this time showed  left occipital encephomalacia   Suspect the lesion was likely occipital ICH at that time with enhancement from retrograde thinking  Other Stroke Risk Factors  Advanced age  Other Active Problems  remote right breast cancer s/p resection without recurrence  left breast cancer s/p resection - ductal carcinoma in situ  Hospital day # 1  This patient is critically ill due to large ICH, IVH, uncal herniation and at significant risk of neurological worsening, death form brain death, uncal herniation, cerebral edema, seizure. This patient's care requires constant monitoring of vital signs, hemodynamics, respiratory and cardiac monitoring, review of multiple databases, neurological assessment, discussion with family, other specialists and medical decision making of high complexity. I spent 45 minutes of neurocritical  care time in the care of this patient. I, along with Dr. Loanne Drilling, had long discussion with husband, son, daughter at bedside, updated pt current condition, treatment plan and potential prognosis. They expressed understanding and appreciation. They request aggressive care at this time.   Rosalin Hawking, MD PhD Stroke Neurology 11/17/2018 5:18 PM    To contact Stroke Continuity provider, please refer to [email or web address] After hours, contact General Neurology

## 2018-11-17 NOTE — Progress Notes (Signed)
Apnea test completed with CCM MD. Pt taken off the ventilator and 8L O2 was placed down the ett with the cuff deflated. Pt began breathing independently however, RR >37. Test continued for 8 minutes per MD Pre apnea test ABG 7.41/34.7/105/22 Post apnea test ABG 7.41/32.7/168/20.7 Results given to CCM MD

## 2018-11-17 NOTE — Progress Notes (Signed)
CRITICAL VALUE ALERT  Critical Value:  Head CT critical results.  Date & Time Notied:  11/17/18 0603  Provider Notified: Dr. Rory Percy  Orders Received/Actions taken: continue 3%, no new orders at this time. MD to discuss POC with family this morning.

## 2018-11-17 NOTE — Consult Note (Signed)
PULMONARY / CRITICAL CARE MEDICINE   NAME:  Savannah Beltran, MRN:  [id number], DOB:  [birth year], LOS: 1 ADMISSION DATE:  11/14/2018, CONSULTATION DATE:  11/30/2018 REFERRING MD:  Neurology CHIEF COMPLAINT:  LOC  BRIEF HISTORY:    85 year old female who presented with slurred speech and left-sided weakness which progressed to unresponsiveness. CT Head wit large right intracerebral hemorrhage with midline shift. Admitted to Neuro ICU  HISTORY OF PRESENT ILLNESS   History is obtained from the chart. The patient is intubated and unresponsive.  Savannah Beltran is a 85 y.o. Caucasian female with PMH of HTN, remote right breast cancer s/p resection without recurrence, left breast cancer s/p resection. She was told that this was a ductal carcinoma in situ and will not have reccurrence of her breast cancer. She was admitted on 12/05/16 for HA and vision changes. MRI showed left occipital enhancing lesion with hemorrhage, concerning for sbuacute hemorrhagic metastasis. Scheduled to have procedure with Dr. Christella Noa but as per grandson later pt went to [facility] for the surgery and was told the "tumor" was no longer present. The surgery was aborted as per grandson.  She continue follow with oncology regularly for breast cancer monitoring.  Today, patient was talking with grandson over the phone at 2:15 PM.  Suddenly patient was noted to have slurry speech and grandson heard patient saying that she was having a stroke.  EMS called, on arrival, patient had slurred speech and left-sided weakness.  Blood pressure extremely high at 224/103 glucose 137.  However quickly, patient become unresponsive.  Patient had to be bagged into ER.  On arrival, patient difficulty breathing, right pupil ballooning, posturing in all extremities parents mentation, no corneal reflexes, weak gag.  Patient was intubated in ER for airway protection.  CT followed after intubation, showed right hemisphere large ICH with significanct midline  shift.  SIGNIFICANT PAST MEDICAL HISTORY   1. Breast cancer 2. HTN  SIGNIFICANT EVENTS:   STUDIES:   CT Head 2/10:  CLINICAL DATA:  Code stroke. Slurred speech. This began earlier today.  EXAM: CT HEAD WITHOUT CONTRAST  TECHNIQUE: Contiguous axial images were obtained from the base of the skull through the vertex without intravenous contrast.  COMPARISON:  MR brain 12/11/2016.  CT head 12/10/2016.  FINDINGS: Brain: Large RIGHT frontal-parietal cerebral hemorrhage, subfalcine shift, marked surrounding edema. Cross-sectional measurements of 74 x 60 x 70 mm corresponds to a volume of approximately 150 mL. RIGHT-to-LEFT shift of 17 mm, possibly greater. RIGHT uncal herniation. Low-attenuation brainstem.  Hemorrhagic LEFT occipital lesion, status post craniotomy, with encephalomalacia. It is unclear if this represented a metastasis, as pathology from the resection did not reveal tumor cells.  Vascular: No hyperdense vessel or unexpected calcification.  Skull: Calvarium intact except for unremarkable appearing craniotomy defect LEFT occipital lobe.  Sinuses/Orbits: Unremarkable.  Other: None.  ASPECTS [facility] Stroke Program Early CT Score)  Noncontributory.  IMPRESSION: Large RIGHT frontal-parietal cerebral hemorrhage, 74 x 60 x 70 mm corresponds to a volume of approximately 150 mL. RIGHT-to-LEFT shift of 17 mm, possibly greater. Neurosurgical consultation may be warranted.  Remote postsurgical change LEFT occipital lobe.  *ASPECTS is not applicable. * Critical Value/emergent results were called by telephone at the time of interpretation on 11/24/2018 at 4:00 pm to Dr. Quintella Reichert , who verbally acknowledged these results.   Electronically Signed   By: Staci Righter M.D.   On: 11/22/2018 16:06    CXR 2/10: IMPRESSION: Endotracheal tube tip is in the right mainstem bronchus.  Recommend withdrawal of 4-4.5 cm.  CT Head 11/17/18: 1.  Interval increased size of large right parietal lobe hemorrhage with increased decussation to the contralateral brain via the corpus callosum. 2. Interval extension of hemorrhage into the ventricles and subarachnoid space. 3. Increased right-to-left midline shift of 20 mm. Increased left ventricular entrapment. Increased effacement of basilar cisterns and downward transtentorial herniation.  CULTURES:    ANTIBIOTICS:    LINES/TUBES:  OGT 2/10 ETT 2/10 CONSULTANTS:  PCCM SUBJECTIVE:  Unable to assess  CONSTITUTIONAL: BP 123/75   Pulse 75   Temp 100.1 F (37.8 C) (Axillary)   Resp 18   Ht 5\' 4"  (1.626 m)   SpO2 100%   BMI 25.16 kg/m   I/O last 3 completed shifts: In: 691.5 [I.V.:691.5] Out: 2700 [Urine:2700]     Vent Mode: PRVC FiO2 (%):  [40 %-100 %] 40 % Set Rate:  [18 bmp] 18 bmp Vt Set:  [430 mL] 430 mL PEEP:  [5 cmH20] 5 cmH20 Plateau Pressure:  [17 cmH20-19 cmH20] 17 cmH20  Physical Exam: General: Unresponsive on minimal sedation, no acute distress HENT: [place], AT, ETT in place Eyes: 51mm pupils, nonreactive, no scleral icterus Respiratory: Mild bilateral rhonchi.  No crackles, wheezing or rales Cardiovascular: RRR, -M/R/G, no JVD GI: BS+, soft, nontender Extremities:-Edema,-tenderness Neuro: Unresponsive, no withdrawal to pain Skin: Intact, no rashes or bruising Psych: Unable to assess  RESOLVED PROBLEM LIST   ASSESSMENT AND PLAN    Right ICH  CT Head on 2/11 with interval extension to ventricles and subarachnoid, worsening midline shift and transtentorial herniation  -Neurology following -Continue 3% hypertonic saline -Continue GOC -Wean sedation to assess neuro status  Acute respiratory failure secondary to right ICH  -Neurology following -Continue 3% hypertonic saline -Perform apnea test off sedation  HTN -Cleviprex gtt -Goal SBP <140  Hx of breast cancer   Best Practice / Goals of Care / Disposition.   DVT  PROPHYLAXIS:SCD's RKY:[id number] NUTRITION: Hold MOBILITY:bedbound GOALS OF CARE: Neurology leading goals of care FAMILY DISCUSSIONS: Daughter at bedside. Advised for patient's HCPOA, husband to be available for [place] discussion this morning DISPOSITION: Remain in ICU  LABS  Glucose Recent Labs  Lab 12/03/2018 2023 11/28/2018 2337 11/17/18 0346 11/17/18 0810  GLUCAP 138* 117* 124* 114*    BMET Recent Labs  Lab 11/25/2018 1518 11/21/2018 2049 11/09/2018 2148 11/17/18 0441  NA 133* 133* 135 143  K 3.9 2.9*  --  3.5  CL 102  --   --  108  CO2 20*  --   --  21*  BUN 10  --   --  8  CREATININE 0.78  --   --  0.71  GLUCOSE 120*  --   --  124*    Liver Enzymes Recent Labs  Lab 11/07/2018 1518  AST 33  ALT 17  ALKPHOS 40  BILITOT 0.8  ALBUMIN 3.9    Electrolytes Recent Labs  Lab 12/01/2018 1518 11/17/18 0441  CALCIUM 9.2 9.3    CBC Recent Labs  Lab 11/15/2018 1518 11/25/2018 2049 11/17/18 0441  WBC 9.3  --  16.2*  HGB 13.5 13.9 14.3  HCT 41.8 41.0 43.4  PLT 219  --  213    ABG Recent Labs  Lab 11/22/2018 2049  PHART 7.388  PCO2ART 39.9  PO2ART 88.0    Coag's Recent Labs  Lab 11/25/2018 1518  APTT 30  INR 1.02   The patient is critically ill with multiple organ systems failure and requires high complexity decision  making for assessment and support, frequent evaluation and titration of therapies, application of advanced monitoring technologies and extensive interpretation of multiple databases.   Critical Care Time devoted to patient care services described in this note is 35 Minutes. This time reflects time of care of this signee Dr. Rodman Pickle. This critical care time does not reflect procedure time, or teaching time or supervisory time of PA/NP/Med student/Med Resident etc but could involve care discussion time.  Rodman Pickle, M.D. [facility] Pulmonary/Critical Care Medicine Pager: [phone] After hours pager: [phone]

## 2018-11-17 NOTE — Care Plan (Signed)
Family meeting held with multiple family members including patient's husband Education officer, community) and daughter. Neurology and Pulmonary team in attendance.  We discussed Savannah Beltran's hospital course since admission including interval worsening of her ICH, midline shift and brain herniation. Dr. Erlinda Hong, Neurology, reviewed her head imaging and expressed concern about her predicted medical course which would eventually lead to brain death regardless of ongoing treatment. I concurred with Dr. Phoebe Sharps prognosis and discussed an option of continuing current care vs transitioning patient to comfort care with compassionate extubation. Husband expressed wish to continue current care at the conclusion of the meeting.  ADDENDUM: After further discussion, family has requested to transition to comfort care when family from [place] arrives tonight.  Will transition to comfort care when family is ready.  Rodman Pickle, M.D. [facility] Pulmonary/Critical Care Medicine Pager: [phone] After hours pager: [phone]

## 2018-11-17 NOTE — Progress Notes (Signed)
CDS Referral # : [id number].

## 2018-11-18 DIAGNOSIS — I612 Nontraumatic intracerebral hemorrhage in hemisphere, unspecified: Secondary | ICD-10-CM

## 2018-11-18 LAB — GLUCOSE, CAPILLARY
Glucose-Capillary: 133 mg/dL — ABNORMAL HIGH (ref 70–99)
Glucose-Capillary: 116 mg/dL — ABNORMAL HIGH (ref 70–99)
Glucose-Capillary: 123 mg/dL — ABNORMAL HIGH (ref 70–99)
Glucose-Capillary: 117 mg/dL — ABNORMAL HIGH (ref 70–99)

## 2018-11-18 LAB — CBC
HCT: 46.7 % — ABNORMAL HIGH (ref 36.0–46.0)
Hemoglobin: 15.5 g/dL — ABNORMAL HIGH (ref 12.0–15.0)
MCH: 27.8 pg (ref 26.0–34.0)
MCHC: 33.2 g/dL (ref 30.0–36.0)
MCV: 83.7 fL (ref 80.0–100.0)
Platelets: 235 10*3/uL (ref 150–400)
RBC: 5.58 MIL/uL — ABNORMAL HIGH (ref 3.87–5.11)
RDW: 13.9 % (ref 11.5–15.5)
WBC: 21.5 10*3/uL — AB (ref 4.0–10.5)
nRBC: 0 % (ref 0.0–0.2)

## 2018-11-18 LAB — MAGNESIUM: Magnesium: 2.2 mg/dL (ref 1.7–2.4)

## 2018-11-18 LAB — SODIUM
Sodium: 158 mmol/L — ABNORMAL HIGH (ref 135–145)
Sodium: 159 mmol/L — ABNORMAL HIGH (ref 135–145)

## 2018-11-18 LAB — BASIC METABOLIC PANEL
Anion gap: 9 (ref 5–15)
BUN: 7 mg/dL — ABNORMAL LOW (ref 8–23)
CO2: 23 mmol/L (ref 22–32)
Calcium: 9.8 mg/dL (ref 8.9–10.3)
Chloride: 122 mmol/L — ABNORMAL HIGH (ref 98–111)
Creatinine, Ser: 0.83 mg/dL (ref 0.44–1.00)
GFR calc non Af Amer: >60 mL/min (ref >60)
Glucose, Bld: 135 mg/dL — ABNORMAL HIGH (ref 70–99)
Potassium: 3.4 mmol/L — ABNORMAL LOW (ref 3.5–5.1)
SODIUM: 154 mmol/L — AB (ref 135–145)

## 2018-11-18 LAB — TRIGLYCERIDES: Triglycerides: 90 mg/dL (ref <150)

## 2018-11-18 LAB — PHOSPHORUS: Phosphorus: 2.6 mg/dL (ref 2.5–4.6)

## 2018-11-18 MED ORDER — CARVEDILOL 12.5 MG PO TABS: 12.5000 mg | ORAL_TABLET | Freq: Two times a day (BID) | ORAL | Status: DC

## 2018-11-18 MED ORDER — HYDRALAZINE HCL 50 MG PO TABS: 50.0000 mg | ORAL_TABLET | Freq: Two times a day (BID) | ORAL | Status: DC

## 2018-11-18 MED ORDER — HYDRALAZINE HCL 50 MG PO TABS
50.0000 mg | ORAL_TABLET | Freq: Two times a day (BID) | ORAL | Status: DC
  Administered 2018-11-18: 50 mg
  Filled 2018-11-18: qty 1

## 2018-11-18 MED ORDER — VITAL HIGH PROTEIN PO LIQD: 1000.0000 mL | ORAL | Status: DC

## 2018-11-18 MED ORDER — PRO-STAT SUGAR FREE PO LIQD: 30.0000 mL | Freq: Two times a day (BID) | ORAL | Status: DC

## 2018-11-18 MED ORDER — CARVEDILOL 12.5 MG PO TABS
12.5000 mg | ORAL_TABLET | Freq: Two times a day (BID) | ORAL | Status: DC
  Filled 2018-11-18: qty 1

## 2018-11-20 DIAGNOSIS — Z853 Personal history of malignant neoplasm of breast: Secondary | ICD-10-CM

## 2018-11-20 DIAGNOSIS — G935 Compression of brain: Secondary | ICD-10-CM

## 2018-11-20 DIAGNOSIS — G936 Cerebral edema: Secondary | ICD-10-CM

## 2018-12-06 NOTE — Progress Notes (Signed)
RT and RN walked in patients room with asystole on the monitor. Patient is DNR. RT just took ventilator off patient.

## 2018-12-06 NOTE — Consult Note (Signed)
PULMONARY / CRITICAL CARE MEDICINE   NAME:  Savannah Beltran, MRN:  [id number], DOB:  1934/03/15, LOS: 2 ADMISSION DATE:  11/09/2018, CONSULTATION DATE:  11/21/2018 REFERRING MD:  Neurology CHIEF COMPLAINT:  LOC  BRIEF HISTORY:    85 year old female who presented with slurred speech and left-sided weakness which progressed to unresponsiveness. CT Head wit large right intracerebral hemorrhage with midline shift. Admitted to Neuro ICU  HISTORY OF PRESENT ILLNESS   History is obtained from the chart. The patient is intubated and unresponsive.  Savannah Beltran is a 85 y.o. Caucasian female with PMH of HTN, remote right breast cancer s/p resection without recurrence, left breast cancer s/p resection. She was told that this was a ductal carcinoma in situ and will not have reccurrence of her breast cancer. She was admitted on 12/05/16 for HA and vision changes. MRI showed left occipital enhancing lesion with hemorrhage, concerning for sbuacute hemorrhagic metastasis. Scheduled to have procedure with Dr. Christella Noa but as per grandson later pt went to [facility] for the surgery and was told the "tumor" was no longer present. The surgery was aborted as per grandson.  She continue follow with oncology regularly for breast cancer monitoring.  Today, patient was talking with grandson over the phone at 2:15 PM.  Suddenly patient was noted to have slurry speech and grandson heard patient saying that she was having a stroke.  EMS called, on arrival, patient had slurred speech and left-sided weakness.  Blood pressure extremely high at 224/103 glucose 137.  However quickly, patient become unresponsive.  Patient had to be bagged into ER.  On arrival, patient difficulty breathing, right pupil ballooning, posturing in all extremities parents mentation, no corneal reflexes, weak gag.  Patient was intubated in ER for airway protection.  CT followed after intubation, showed right hemisphere large ICH with significanct midline  shift.  SIGNIFICANT PAST MEDICAL HISTORY   1. Breast cancer 2. HTN  SIGNIFICANT EVENTS:   STUDIES:   CT Head 11/24/2018: Large RIGHT frontal-parietal cerebral hemorrhage, 74 x 60 x 70 mm corresponds to a volume of approximately 150 mL. RIGHT-to-LEFT shift of 17 mm, possibly greater. Neurosurgical consultation may be warranted.  Remote postsurgical change LEFT occipital lobe. CXR 2/10: IMPRESSION: Endotracheal tube tip is in the right mainstem bronchus. Recommend withdrawal of 4-4.5 cm.  CT Head 11/17/18: 1. Interval increased size of large right parietal lobe hemorrhage with increased decussation to the contralateral brain via the corpus callosum. 2. Interval extension of hemorrhage into the ventricles and subarachnoid space. 3. Increased right-to-left midline shift of 20 mm. Increased left ventricular entrapment. Increased effacement of basilar cisterns and downward transtentorial herniation.  CULTURES:    ANTIBIOTICS:    LINES/TUBES:  OGT 2/10 ETT 2/10 CONSULTANTS:  PCCM SUBJECTIVE:  Family at bedside. No events overnight. After apnea test, husband and family wishes to continue current care. Apnea test did not meet criteria for brain death.  CONSTITUTIONAL: BP (!) 145/88   Pulse (!) 116   Temp (!) 101.4 F (38.6 C) (Axillary) Comment: tylenol given  Resp (!) 25   Ht 5\' 4"  (1.626 m)   SpO2 98%   BMI 25.16 kg/m   I/O last 3 completed shifts: In: 1915.4 [I.V.:1915.4] Out: 5200 [Urine:5200]    Vent Mode: PRVC FiO2 (%):  [30 %-40 %] 30 % Set Rate:  [18 bmp] 18 bmp Vt Set:  [430 mL] 430 mL PEEP:  [5 cmH20] 5 cmH20 Plateau Pressure:  [16 cmH20-18 cmH20] 16 cmH20  Physical Exam:  General: Unresponsive on minimal sedation, no acute distress HENT: [place], AT, ETT in place Eyes: 69mm pupils, nonreactive, no scleral icterus Respiratory: Mild bilateral rhonchi.  No crackles, wheezing or rales Cardiovascular: RRR, -M/R/G, no JVD GI: BS+, soft,  nontender Extremities:-Edema,-tenderness Neuro: Unresponsive, no withdrawal to pain Skin: Intact, no rashes or bruising Psych: Unable to assess  Physical Exam: General: Elderly female laying in bed, no acute distress HENT: [place], AT, ETT in place Respiratory: Overbreathing the vent. Clear to auscultation bilaterally.  No crackles, wheezing or rales Cardiovascular: RRR, -M/R/G, no JVD GI: BS+, soft, nontender Extremities:-Edema,-tenderness Neuro: Unresponsive, no withdrawal   RESOLVED PROBLEM LIST   ASSESSMENT AND PLAN    Right ICH  CT Head on 2/11 with interval extension to ventricles and subarachnoid, worsening midline shift and transtentorial herniation  -Neurology following. Worsening neuro exam with decreased reflexes  -Continue 3% hypertonic saline -Primary team and consult team will continue [place] discussions.   Acute respiratory failure secondary to right ICH  -Full vent support -Consider repeating apnea test for worsening neuro exam  HTN -Cleviprex gtt -Goal SBP <140  Hx of breast cancer   Best Practice / Goals of Care / Disposition.   DVT PROPHYLAXIS:SCD's VZC:HYIFOYDX NUTRITION: Hold MOBILITY:bedbound GOALS OF CARE: Neurology leading goals of care FAMILY DISCUSSIONS: Husband and daughter updated that we will continue current care DISPOSITION: Remain in ICU  LABS  Glucose Recent Labs  Lab 11/17/18 0346 11/17/18 0810 11/17/18 1128 11/17/18 1540 29-Nov-2018 0354 November 29, 2018 0800  GLUCAP 124* 114* 116* 134* 133* 116*    BMET Recent Labs  Lab 11/11/2018 1518  11/17/18 0441  11/17/18 1745 11/17/18 2111 2018/11/29 0302  NA 133*   < > 143   < > 151* 151* 154*  K 3.9   < > 3.5  --  3.4*  --  3.4*  CL 102  --  108  --   --   --  122*  CO2 20*  --  21*  --   --   --  23  BUN 10  --  8  --   --   --  7*  CREATININE 0.78  --  0.71  --   --   --  0.83  GLUCOSE 120*  --  124*  --   --   --  135*   < > = values in this interval not displayed.    Liver  Enzymes Recent Labs  Lab 12/01/2018 1518  AST 33  ALT 17  ALKPHOS 40  BILITOT 0.8  ALBUMIN 3.9    Electrolytes Recent Labs  Lab 11/22/2018 1518 11/17/18 0441 11-29-2018 0302  CALCIUM 9.2 9.3 9.8    CBC Recent Labs  Lab 11/17/18 0441 11/17/18 1745 11/17/18 2111 November 29, 2018 0302  WBC 16.2*  --  23.0* 21.5*  HGB 14.3 13.3 14.9 15.5*  HCT 43.4 39.0 46.1* 46.7*  PLT 213  --  224 235    ABG Recent Labs  Lab 12/04/2018 2049 11/17/18 1745 11/17/18 1810  PHART 7.388 7.412 7.417  PCO2ART 39.9 34.7 32.7  PO2ART 88.0 105.0 168*    Coag's Recent Labs  Lab 11/07/2018 1518  APTT 30  INR 1.02   The patient is critically ill with multiple organ systems failure and requires high complexity decision making for assessment and support, frequent evaluation and titration of therapies, application of advanced monitoring technologies and extensive interpretation of multiple databases.   Critical Care Time devoted to patient care services described in this note is 32  Minutes. This time reflects time of care of this signee Dr. Rodman Pickle. This critical care time does not reflect procedure time, or teaching time or supervisory time of PA/NP/Med student/Med Resident etc but could involve care discussion time.  Rodman Pickle, M.D. [facility] Pulmonary/Critical Care Medicine Pager: [phone] After hours pager: [phone]

## 2018-12-06 NOTE — Progress Notes (Signed)
Informed by patient RN of the patient passing.  Death certificate signed.  -- Amie Portland, MD [facility] Pager: [phone] If 7pm to 7am, please call on call as listed on AMION.

## 2018-12-06 NOTE — Progress Notes (Signed)
STROKE TEAM PROGRESS NOTE   SUBJECTIVE (INTERVAL HISTORY) 2 sons at bedside. Daughter in [place] but not at the hospital. Sons are supporting dad's decision to continue aggressive care. He has faith God will heal her.   OBJECTIVE Temp:  [98 F (36.7 C)-101.4 F (38.6 C)] 101.4 F (38.6 C) (02/12 0800) Pulse Rate:  [66-121] 118 (02/12 0747) Cardiac Rhythm: Sinus tachycardia (02/12 0400) Resp:  [18-32] 24 (02/12 0747) BP: (111-153)/(63-93) 130/77 (02/12 0747) SpO2:  [98 %-100 %] 99 % (02/12 0747) FiO2 (%):  [30 %-40 %] 30 % (02/12 0747)  Recent Labs  Lab 11/17/18 0810 11/17/18 1128 11/17/18 1540 2018/11/23 0354 23-Nov-2018 0800  GLUCAP 114* 116* 134* 133* 116*   Recent Labs  Lab 11/08/2018 1518 11/07/2018 2049  11/17/18 0441 11/17/18 1447 11/17/18 1745 11/17/18 2111 Nov 23, 2018 0302  NA 133* 133*   < > 143 149* 151* 151* 154*  K 3.9 2.9*  --  3.5  --  3.4*  --  3.4*  CL 102  --   --  108  --   --   --  122*  CO2 20*  --   --  21*  --   --   --  23  GLUCOSE 120*  --   --  124*  --   --   --  135*  BUN 10  --   --  8  --   --   --  7*  CREATININE 0.78  --   --  0.71  --   --   --  0.83  CALCIUM 9.2  --   --  9.3  --   --   --  9.8   < > = values in this interval not displayed.   Recent Labs  Lab 11/19/2018 1518  AST 33  ALT 17  ALKPHOS 40  BILITOT 0.8  PROT 6.8  ALBUMIN 3.9   Recent Labs  Lab 11/20/2018 1518 11/15/2018 2049 11/17/18 0441 11/17/18 1745 11/17/18 2111 23-Nov-2018 0302  WBC 9.3  --  16.2*  --  23.0* 21.5*  NEUTROABS 4.7  --   --   --   --   --   HGB 13.5 13.9 14.3 13.3 14.9 15.5*  HCT 41.8 41.0 43.4 39.0 46.1* 46.7*  MCV 84.1  --  83.9  --  84.0 83.7  PLT 219  --  213  --  224 235       Component Value Date/Time   LABOPIA NONE DETECTED 12/05/2016 1743   COCAINSCRNUR NONE DETECTED 12/05/2016 1743   LABBENZ NONE DETECTED 12/05/2016 1743   AMPHETMU NONE DETECTED 12/05/2016 1743   THCU NONE DETECTED 12/05/2016 1743   LABBARB POSITIVE (A) 12/05/2016 1743      Ct Head Wo Contrast  Result Date: 11/17/2018 CLINICAL DATA:  85 y/o  F; intracranial hemorrhage for follow-up. EXAM: CT HEAD WITHOUT CONTRAST TECHNIQUE: Contiguous axial images were obtained from the base of the skull through the vertex without intravenous contrast. COMPARISON:  11/27/2018 CT head. 12/11/2016 MRI head. FINDINGS: Brain: Interval increased size of brain parenchymal hemorrhage centered within the right parietal lobe measuring 7.0 x 8.4 cm in the axial plane, previously 6.5 x 7.4 cm. Increased decussation of hemorrhage via the corpus callosum to the contralateral left cingulate gyrus. Interval extension of hemorrhage into the ventricles and subarachnoid space. Increased right-to-left midline shift of 20 mm. Left ventricular entrapment. Effacement of the suprasellar cistern and right greater than left quadrigeminal plate cisterns. Progression of downward  transtentorial herniation. No downward herniation via the foramen magnum at this time. Stable chronic infarct of left occipital lobe. Vascular: No hyperdense vessel or unexpected calcification. Skull: Normal. Negative for fracture or focal lesion. Sinuses/Orbits: No acute finding. Other: None. IMPRESSION: 1. Interval increased size of large right parietal lobe hemorrhage with increased decussation to the contralateral brain via the corpus callosum. 2. Interval extension of hemorrhage into the ventricles and subarachnoid space. 3. Increased right-to-left midline shift of 20 mm. Increased left ventricular entrapment. Increased effacement of basilar cisterns and downward transtentorial herniation. These results will be called to the ordering clinician or representative by the Radiologist Assistant, and communication documented in the PACS or zVision Dashboard. Electronically Signed   By: Kristine Garbe M.D.   On: 11/17/2018 05:24   Dg Chest Port 1 View  Result Date: 11/17/2018 CLINICAL DATA:  Respiratory failure. EXAM: PORTABLE CHEST 1  VIEW COMPARISON:  Radiograph of November 16, 2018. FINDINGS: Stable cardiomediastinal silhouette. Atherosclerosis of thoracic aorta is noted. Endotracheal and nasogastric tubes are in grossly good position. No pneumothorax or pleural effusion is noted. Right lung is clear. Mild left basilar atelectasis or infiltrate is noted. IMPRESSION: Mild left basilar subsegmental atelectasis or infiltrate is noted. Endotracheal and nasogastric tubes in grossly good position. Aortic Atherosclerosis (ICD10-I70.0). Electronically Signed   By: Marijo Conception, M.D.   On: 11/17/2018 07:42   Dg Chest Port 1 View  Result Date: 11/23/2018 CLINICAL DATA:  ET tube EXAM: PORTABLE CHEST 1 VIEW COMPARISON:  11/09/2018 FINDINGS: Endotracheal tube has been retracted with the tip at the carina. This could be pulled back further approximately 2 cm for optimal positioning. NG tube is in the stomach. Cardiomegaly. Aortic atherosclerosis and ectasia. Left basilar opacity, likely atelectasis. Right lung clear. No effusions. IMPRESSION: Endotracheal tube is at the carina. This could be retracted 2 cm further for optimal positioning. Left base atelectasis or infiltrate. Mild cardiomegaly. Electronically Signed   By: Rolm Baptise M.D.   On: 11/21/2018 20:58   Dg Chest Portable 1 View  Result Date: 11/14/2018 CLINICAL DATA:  Status post intubation. EXAM: PORTABLE CHEST 1 VIEW COMPARISON:  Single-view of the chest earlier today scratch the single view of the chest 12/12/2016. FINDINGS: Endotracheal tube tip is in the right mainstem bronchus. Recommend withdrawal of 4-4.5 cm. NG tube courses into the stomach and below the inferior margin of the film. Lungs are clear. Heart size is normal. No pneumothorax or pleural effusion. IMPRESSION: Endotracheal tube tip is in the right mainstem bronchus. Recommend withdrawal of 4-4.5 cm. NGT courses into the stomach and below the inferior margin the film. Clear lungs. Atherosclerosis. These results were  called by telephone at the time of interpretation on 11/24/2018 at 3:46 pm to Dr. Threasa Beards BELFI, who verbally acknowledged these results. Electronically Signed   By: Inge Rise M.D.   On: 11/23/2018 15:47   Ct Head Code Stroke Wo Contrast  Result Date: 12/04/2018 CLINICAL DATA:  Code stroke. Slurred speech. This began earlier today. EXAM: CT HEAD WITHOUT CONTRAST TECHNIQUE: Contiguous axial images were obtained from the base of the skull through the vertex without intravenous contrast. COMPARISON:  MR brain 12/11/2016.  CT head 12/10/2016. FINDINGS: Brain: Large RIGHT frontal-parietal cerebral hemorrhage, subfalcine shift, marked surrounding edema. Cross-sectional measurements of 74 x 60 x 70 mm corresponds to a volume of approximately 150 mL. RIGHT-to-LEFT shift of 17 mm, possibly greater. RIGHT uncal herniation. Low-attenuation brainstem. Hemorrhagic LEFT occipital lesion, status post craniotomy, with encephalomalacia. It is unclear if  this represented a metastasis, as pathology from the resection did not reveal tumor cells. Vascular: No hyperdense vessel or unexpected calcification. Skull: Calvarium intact except for unremarkable appearing craniotomy defect LEFT occipital lobe. Sinuses/Orbits: Unremarkable. Other: None. ASPECTS [facility] Stroke Program Early CT Score) Noncontributory. IMPRESSION: Large RIGHT frontal-parietal cerebral hemorrhage, 74 x 60 x 70 mm corresponds to a volume of approximately 150 mL. RIGHT-to-LEFT shift of 17 mm, possibly greater. Neurosurgical consultation may be warranted. Remote postsurgical change LEFT occipital lobe. *ASPECTS is not applicable. * Critical Value/emergent results were called by telephone at the time of interpretation on 11/26/2018 at 4:00 pm to Dr. Quintella Reichert , who verbally acknowledged these results. Electronically Signed   By: Staci Righter M.D.   On: 11/27/2018 16:06     PHYSICAL EXAM General -, intubated, minimal sedation      Cardiovascular  - regular rate and rhythm, bradycardia  Neuro -  Comatose Not following commands Pupils are 98mm and non reactive b/l Absent corneals Absent cough Minimal gag No movement in the UE's to pain stimuli Minimal withdrawal in LE's Overbreathing Vent   ASSESSMENT/PLAN Savannah Beltran is a 85 y.o. female with history of hypertension, bilateral breast cancer status post resection, previous left occipital brain lesion admitted for slurred speech, left-sided weakness and unresponsive.  Required intubation for airway protection.  Exam showed uncal herniation.  CT showed right large ICH with significant midline shift, uncal herniation, unsurvivable. Updated husband with imaging findings, clinical presentation, and poor prognosis.  husband agreed with DNR but insisted aggressive care at this time.    ICH:  right hemisphere large ICH with significant midline shift and uncal herniation, IVH, likely hypertensive bleed  CT head right large hemisphere ICH with midline shift, uncal herniation  CT repeat large right hemisphere ICH with increased midline shift and uncal herniation. IVH became noticeable.   On fentanyl  SCDs for VTE prophylaxis  aspirin 81 mg daily prior to admission, now on No antithrombotic.   Disposition:  unsurvivable ICH - discussed with family, updated condition and treatment options.   Long discussion with whole family in conference room. Explained very poor prognosis and quality of life. Family wants to continue aggressive care and are ok with early Trach/PEG if she does not progress to brain death.  Cerebral edema Uncal herniation  Anisocoria  CT confirmation  On 3% saline  Na  154  Na goal 150-155  Na Q6h  Not candidate for NSG  Continue 3% after 24h via PIV given likely comfort care  Hypertensive emergency . Stable . Off cleviprex this am with BP 143  Increase SBP goal < 160  Left occipital lesion  12/2016 MRI with and without contrast concerning  for metastatic brain tumor as pt has hx of breast cancer   Had craniotomy with Dr. Christella Noa but found no tumor during the surgery  CT this time showed left occipital encephomalacia   Suspect the lesion was likely occipital ICH at that time with enhancement from retrograde thinking  Other Stroke Risk Factors  Advanced age  Other Active Problems  remote right breast cancer s/p resection without recurrence  left breast cancer s/p resection - ductal carcinoma in [facility] day # [place], MSN, APRN, ANVP-BC, AGPCNP-BC Advanced Practice Stroke Nurse [place] for Schedule & Pager information December 02, 2018 9:18 AM   Long discussion with whole family in conference room. Explained very poor prognosis and quality of life. Family wants to continue aggressive  care and are ok with early Trach/PEG if she does not progress to brain death. Agree with plan as above   To contact Stroke Continuity provider, please refer to [email or web address] After hours, contact General Neurology

## 2018-12-06 NOTE — Progress Notes (Signed)
PMT consult received and chart reviewed. Discussed with Dr. Cristobal Goldmann who just spoke with family about goals of care. Family wishes to continue aggressive care including trach/peg if necessary. Per MD, no palliative needs. Please re-consult PMT if we can further assist in [place] discussions. Thank you.  NO CHARGE  Ihor Dow, [place], FNP-C Palliative Medicine Team  Phone: [phone] Fax: [phone]

## 2018-12-06 NOTE — Progress Notes (Addendum)
Patient cardiac heart monitor alarming for pause. Upon assessment patient had a seven beat run of vt and became bradycardic and time of death was @ 1715. Time of death was verified by two RN's. Patient's son was at the bedside and all other family was notified. Dr Loanne Drilling came and spoke with the family and Dr Cristobal Goldmann was notified. RT removed breathing tube. Patient is not an ME case. CDS was notified of time of death.

## 2018-12-06 NOTE — Procedures (Signed)
Extubation Procedure Note  Patient Details:   Name: Savannah Beltran DOB: 09-13-1934 MRN: [id number]   Airway Documentation:    Vent end date: Dec 18, 2018 Vent end time: 6999   Pt expired on vent at 76. Pt extubated per MD request at 1744.     Jesse Sans 18-Dec-2018, 5:43 PM

## 2018-12-06 NOTE — Progress Notes (Signed)
Nutrition Brief Note  Chart reviewed. CCM and Neurology recommend transition to comfort care after family arrives.  No nutrition interventions planned at this time by the medical team.  Please consult as needed.   [place], [place], [place] Pager [phone] After Hours Pager

## 2018-12-06 NOTE — Discharge Summary (Signed)
Stroke Discharge Summary  Patient ID: Savannah Beltran   MRN: [id number]      DOB: September 18, 1934  Date of Admission: 11/15/2018 Date of Discharge: 12/18/2018  Attending Physician:  Stroke MD Consultant(s):    pulmonary/intensive care  Patient's PCP:  Lowella Dandy, NP  DISCHARGE DIAGNOSIS:  Active Problems:   ICH (intracerebral hemorrhage) ([place])   Past Medical History:  Diagnosis Date  . Anemia    during pregnancy  . Brain tumor ([place])   . Cancer [facility])    Breast Cancer  . Heart murmur   . Heartburn   . Hypertension   . Migraines   . PONV (postoperative nausea and vomiting)    Past Surgical History:  Procedure Laterality Date  . APPLICATION OF CRANIAL NAVIGATION Left 12/12/2016   Procedure: APPLICATION OF CRANIAL NAVIGATION;  Surgeon: Ashok Pall, MD;  Location: [place];  Service: Neurosurgery;  Laterality: Left;  . BREAST SURGERY    . COLONOSCOPY W/ BIOPSIES AND POLYPECTOMY    . CRANIOTOMY Left 12/12/2016   Procedure: OCCIPITAL CRANIOTOMY FOR BRAIN TUMOR;  Surgeon: Ashok Pall, MD;  Location: [place];  Service: Neurosurgery;  Laterality: Left;  . DILATION AND CURETTAGE OF UTERUS    . Partial hip replacement        LABORATORY STUDIES CBC    Component Value Date/Time   WBC 21.5 (H) 18-Dec-2018 0302   RBC 5.58 (H) 12/18/2018 0302   HGB 15.5 (H) 12/18/18 0302   HCT 46.7 (H) 2018/12/18 0302   PLT 235 18-Dec-2018 0302   MCV 83.7 12-18-2018 0302   MCH 27.8 12/18/2018 0302   MCHC 33.2 12/18/2018 0302   RDW 13.9 12/18/2018 0302   LYMPHSABS 3.4 12/03/2018 1518   MONOABS 0.9 11/17/2018 1518   EOSABS 0.2 11/17/2018 1518   BASOSABS 0.1 11/09/2018 1518   CMP    Component Value Date/Time   NA 159 (H) 12-18-2018 1458   K 3.4 (L) 12/18/18 0302   CL 122 (H) December 18, 2018 0302   CO2 23 December 18, 2018 0302   GLUCOSE 135 (H) 2018-12-18 0302   BUN 7 (L) 12-18-18 0302   CREATININE 0.83 12/18/2018 0302   CALCIUM 9.8 12/18/2018 0302   PROT 6.8 11/15/2018 1518   ALBUMIN 3.9  11/11/2018 1518   AST 33 11/24/2018 1518   ALT 17 11/20/2018 1518   ALKPHOS 40 11/20/2018 1518   BILITOT 0.8 11/28/2018 1518   GFRNONAA >60 12/18/18 0302   GFRAA >60 12-18-18 0302   Lipid Panel    Component Value Date/Time   TRIG 90 12-18-2018 0302     SIGNIFICANT DIAGNOSTIC STUDIES Ct Head Wo Contrast  Result Date: 11/17/2018 CLINICAL DATA:  85 y/o  F; intracranial hemorrhage for follow-up. EXAM: CT HEAD WITHOUT CONTRAST TECHNIQUE: Contiguous axial images were obtained from the base of the skull through the vertex without intravenous contrast. COMPARISON:  11/08/2018 CT head. 12/11/2016 MRI head. FINDINGS: Brain: Interval increased size of brain parenchymal hemorrhage centered within the right parietal lobe measuring 7.0 x 8.4 cm in the axial plane, previously 6.5 x 7.4 cm. Increased decussation of hemorrhage via the corpus callosum to the contralateral left cingulate gyrus. Interval extension of hemorrhage into the ventricles and subarachnoid space. Increased right-to-left midline shift of 20 mm. Left ventricular entrapment. Effacement of the suprasellar cistern and right greater than left quadrigeminal plate cisterns. Progression of downward transtentorial herniation. No downward herniation via the foramen magnum at this time. Stable chronic infarct of left occipital lobe. Vascular: No hyperdense  vessel or unexpected calcification. Skull: Normal. Negative for fracture or focal lesion. Sinuses/Orbits: No acute finding. Other: None. IMPRESSION: 1. Interval increased size of large right parietal lobe hemorrhage with increased decussation to the contralateral brain via the corpus callosum. 2. Interval extension of hemorrhage into the ventricles and subarachnoid space. 3. Increased right-to-left midline shift of 20 mm. Increased left ventricular entrapment. Increased effacement of basilar cisterns and downward transtentorial herniation. These results will be called to the ordering clinician or  representative by the Radiologist Assistant, and communication documented in the PACS or zVision Dashboard. Electronically Signed   By: Kristine Garbe M.D.   On: 11/17/2018 05:24   Dg Chest Port 1 View  Result Date: 11/17/2018 CLINICAL DATA:  Respiratory failure. EXAM: PORTABLE CHEST 1 VIEW COMPARISON:  Radiograph of November 16, 2018. FINDINGS: Stable cardiomediastinal silhouette. Atherosclerosis of thoracic aorta is noted. Endotracheal and nasogastric tubes are in grossly good position. No pneumothorax or pleural effusion is noted. Right lung is clear. Mild left basilar atelectasis or infiltrate is noted. IMPRESSION: Mild left basilar subsegmental atelectasis or infiltrate is noted. Endotracheal and nasogastric tubes in grossly good position. Aortic Atherosclerosis (ICD10-I70.0). Electronically Signed   By: Marijo Conception, M.D.   On: 11/17/2018 07:42   Dg Chest Port 1 View  Result Date: 11/26/2018 CLINICAL DATA:  ET tube EXAM: PORTABLE CHEST 1 VIEW COMPARISON:  11/19/2018 FINDINGS: Endotracheal tube has been retracted with the tip at the carina. This could be pulled back further approximately 2 cm for optimal positioning. NG tube is in the stomach. Cardiomegaly. Aortic atherosclerosis and ectasia. Left basilar opacity, likely atelectasis. Right lung clear. No effusions. IMPRESSION: Endotracheal tube is at the carina. This could be retracted 2 cm further for optimal positioning. Left base atelectasis or infiltrate. Mild cardiomegaly. Electronically Signed   By: Rolm Baptise M.D.   On: 11/19/2018 20:58   Dg Chest Portable 1 View  Result Date: 11/28/2018 CLINICAL DATA:  Status post intubation. EXAM: PORTABLE CHEST 1 VIEW COMPARISON:  Single-view of the chest earlier today scratch the single view of the chest 12/12/2016. FINDINGS: Endotracheal tube tip is in the right mainstem bronchus. Recommend withdrawal of 4-4.5 cm. NG tube courses into the stomach and below the inferior margin of the film.  Lungs are clear. Heart size is normal. No pneumothorax or pleural effusion. IMPRESSION: Endotracheal tube tip is in the right mainstem bronchus. Recommend withdrawal of 4-4.5 cm. NGT courses into the stomach and below the inferior margin the film. Clear lungs. Atherosclerosis. These results were called by telephone at the time of interpretation on 11/17/2018 at 3:46 pm to Dr. Threasa Beards BELFI, who verbally acknowledged these results. Electronically Signed   By: Inge Rise M.D.   On: 11/17/2018 15:47   Ct Head Code Stroke Wo Contrast  Result Date: 11/27/2018 CLINICAL DATA:  Code stroke. Slurred speech. This began earlier today. EXAM: CT HEAD WITHOUT CONTRAST TECHNIQUE: Contiguous axial images were obtained from the base of the skull through the vertex without intravenous contrast. COMPARISON:  MR brain 12/11/2016.  CT head 12/10/2016. FINDINGS: Brain: Large RIGHT frontal-parietal cerebral hemorrhage, subfalcine shift, marked surrounding edema. Cross-sectional measurements of 74 x 60 x 70 mm corresponds to a volume of approximately 150 mL. RIGHT-to-LEFT shift of 17 mm, possibly greater. RIGHT uncal herniation. Low-attenuation brainstem. Hemorrhagic LEFT occipital lesion, status post craniotomy, with encephalomalacia. It is unclear if this represented a metastasis, as pathology from the resection did not reveal tumor cells. Vascular: No hyperdense vessel or unexpected calcification. Skull:  Calvarium intact except for unremarkable appearing craniotomy defect LEFT occipital lobe. Sinuses/Orbits: Unremarkable. Other: None. ASPECTS [facility] Stroke Program Early CT Score) Noncontributory. IMPRESSION: Large RIGHT frontal-parietal cerebral hemorrhage, 74 x 60 x 70 mm corresponds to a volume of approximately 150 mL. RIGHT-to-LEFT shift of 17 mm, possibly greater. Neurosurgical consultation may be warranted. Remote postsurgical change LEFT occipital lobe. *ASPECTS is not applicable. * Critical Value/emergent results were  called by telephone at the time of interpretation on 11/13/2018 at 4:00 pm to Dr. Quintella Reichert , who verbally acknowledged these results. Electronically Signed   By: Staci Righter M.D.   On: 11/08/2018 16:06        HISTORY OF PRESENT ILLNESS Savannah Beltran is a 85 y.o. Caucasian female with PMH of HTN, remote right breast cancer s/p resection without recurrence, left breast cancer s/p resection. She was told that this was a ductal carcinoma in situ and will not have reccurrence of her breast cancer. She was admitted on 12/05/16 for HA and vision changes. MRI showed left occipital enhancing lesion with hemorrhage, concerning for sbuacute hemorrhagic metastasis. Scheduled to have procedure with Dr. Christella Noa but as per grandson later pt went to [facility] for the surgery and was told the "tumor" was no longer present. The surgery was aborted as per grandson.  She continue follow with oncology regularly for breast cancer monitoring.  Today, 12/04/2018, patient was talking with grandson over the phone at 2:15 PM (LKW).  Suddenly patient was noted to have slurry speech and grandson heard patient saying that she was having a stroke.  EMS called, on arrival, patient had slurred speech and left-sided weakness.  Blood pressure extremely high at 224/103 glucose 137.  However quickly, patient become unresponsive.  Patient had to be bagged into ER.  On arrival, patient difficulty breathing, right pupil ballooning, posturing in all extremities parents mentation, no corneal reflexes, weak gag.  Patient was intubated in ER for airway protection.  CT followed after intubation, showed right hemisphere large ICH with significanct midline shift.  She was admitted to the neuro ICU for further evaluation and treatment.   HOSPITAL COURSE Savannah Beltran is a 85 y.o. female with history of hypertension, bilateral breast cancer status post resection, previous left occipital brain lesion admitted for slurred speech, left-sided  weakness and unresponsive. Required intubation for airway protection. Exam showed uncal herniation. CT showed right large ICH with significantmidline shift, uncal herniation, unsurvivable. Updated husband with imaging findings, clinical presentation, and poor prognosis. Husband agreed with DNR but insisted aggressive care with plans for long-term placement in a vent facility.  She developed spontaneous 7 beat run of V. tach and became bradycardic.  Time of death was 12-13-18 at 1715.   ICH:  right hemisphere large ICH with significant midline shift and uncal herniation, IVH, likely hypertensive bleed  Unsurvivable   CT head right large hemisphere ICH with midline shift, uncal herniation  CT repeat large right hemisphere ICH with increased midline shift and uncal herniation. IVH became noticeable.   Treated with fentanyl  aspirin 81 mg daily prior to admission, on No antithrombotic in hospital  Disposition:  expired  Cerebral edema Uncal herniation  Anisocoria  CT confirmation  Treated with 3% saline protocol per PIV  Not candidate for NSG  Hypertensive emergency  224/103 on arrival  Treated with Cleviprex   Left occipital lesion  12/2016 MRI with and without contrast concerning for metastatic brain tumor as pt has hx of breast cancer   Had craniotomy  with Dr. Christella Noa but found no tumor during the surgery  CT this time showed left occipital encephomalacia   Suspect the lesion was likely occipital ICH at that time with enhancement from retrograde thinking  Other Stroke Risk Factors  Advanced age  Other Active Problems  remote right breast cancer s/p resection without recurrence  left breast cancer s/p resection - ductal carcinoma in situ   DISCHARGE PLAN  Disposition:  expired  30 minutes were spent preparing discharge.  Burnetta Sabin, MSN, APRN, ANVP-BC, AGPCNP-BC Advanced Practice Stroke Nurse [place] for Schedule &  Pager information 11/20/2018 4:05 PM

## 2018-12-06 DEATH — deceased
